# Patient Record
Sex: Female | Born: 1970
Health system: Southern US, Community
[De-identification: ages and names within clinical notes are randomized; demographics above are authoritative.]

## PROBLEM LIST (undated history)

## (undated) DIAGNOSIS — R51 Headache: Secondary | ICD-10-CM

## (undated) DIAGNOSIS — Z9889 Other specified postprocedural states: Secondary | ICD-10-CM

## (undated) DIAGNOSIS — T7840XA Allergy, unspecified, initial encounter: Secondary | ICD-10-CM

## (undated) DIAGNOSIS — K5790 Diverticulosis of intestine, part unspecified, without perforation or abscess without bleeding: Secondary | ICD-10-CM

## (undated) DIAGNOSIS — F419 Anxiety disorder, unspecified: Secondary | ICD-10-CM

## (undated) DIAGNOSIS — R112 Nausea with vomiting, unspecified: Secondary | ICD-10-CM

## (undated) DIAGNOSIS — K219 Gastro-esophageal reflux disease without esophagitis: Secondary | ICD-10-CM

## (undated) DIAGNOSIS — R519 Headache, unspecified: Secondary | ICD-10-CM

## (undated) HISTORY — PX: PELVIC LAPAROSCOPY: SHX162

## (undated) HISTORY — PX: WISDOM TOOTH EXTRACTION: SHX21

## (undated) HISTORY — PX: DIAGNOSTIC LAPAROSCOPY: SUR761

## (undated) HISTORY — DX: Gastro-esophageal reflux disease without esophagitis: K21.9

## (undated) HISTORY — DX: Diverticulosis of intestine, part unspecified, without perforation or abscess without bleeding: K57.90

## (undated) HISTORY — DX: Allergy, unspecified, initial encounter: T78.40XA

## (undated) HISTORY — PX: CHOLECYSTECTOMY: SHX55

---

## 1996-08-16 HISTORY — PX: CYSTOSCOPY: SUR368

## 1998-02-25 ENCOUNTER — Other Ambulatory Visit: Admission: RE | Admit: 1998-02-25 | Discharge: 1998-02-25 | Payer: Self-pay | Admitting: *Deleted

## 1999-03-20 ENCOUNTER — Other Ambulatory Visit: Admission: RE | Admit: 1999-03-20 | Discharge: 1999-03-20 | Payer: Self-pay | Admitting: *Deleted

## 1999-07-01 ENCOUNTER — Other Ambulatory Visit: Admission: RE | Admit: 1999-07-01 | Discharge: 1999-07-01 | Payer: Self-pay | Admitting: *Deleted

## 1999-11-06 ENCOUNTER — Encounter: Admission: RE | Admit: 1999-11-06 | Discharge: 2000-02-04 | Payer: Self-pay | Admitting: *Deleted

## 2000-01-02 ENCOUNTER — Inpatient Hospital Stay (HOSPITAL_COMMUNITY): Admission: AD | Admit: 2000-01-02 | Discharge: 2000-01-02 | Payer: Self-pay | Admitting: *Deleted

## 2000-01-08 ENCOUNTER — Inpatient Hospital Stay (HOSPITAL_COMMUNITY): Admission: AD | Admit: 2000-01-08 | Discharge: 2000-01-11 | Payer: Self-pay | Admitting: *Deleted

## 2000-02-10 ENCOUNTER — Other Ambulatory Visit: Admission: RE | Admit: 2000-02-10 | Discharge: 2000-02-10 | Payer: Self-pay | Admitting: *Deleted

## 2001-02-20 ENCOUNTER — Other Ambulatory Visit: Admission: RE | Admit: 2001-02-20 | Discharge: 2001-02-20 | Payer: Self-pay | Admitting: *Deleted

## 2001-08-16 HISTORY — PX: COLPOSCOPY: SHX161

## 2002-02-28 ENCOUNTER — Other Ambulatory Visit: Admission: RE | Admit: 2002-02-28 | Discharge: 2002-02-28 | Payer: Self-pay | Admitting: Obstetrics & Gynecology

## 2002-04-09 ENCOUNTER — Other Ambulatory Visit: Admission: RE | Admit: 2002-04-09 | Discharge: 2002-04-09 | Payer: Self-pay | Admitting: *Deleted

## 2002-09-14 ENCOUNTER — Other Ambulatory Visit: Admission: RE | Admit: 2002-09-14 | Discharge: 2002-09-14 | Payer: Self-pay | Admitting: Obstetrics & Gynecology

## 2003-04-05 ENCOUNTER — Other Ambulatory Visit: Admission: RE | Admit: 2003-04-05 | Discharge: 2003-04-05 | Payer: Self-pay | Admitting: Obstetrics & Gynecology

## 2013-08-16 HISTORY — PX: CHOLECYSTECTOMY: SHX55

## 2014-01-03 ENCOUNTER — Encounter: Payer: Self-pay | Admitting: Obstetrics & Gynecology

## 2015-03-04 ENCOUNTER — Other Ambulatory Visit: Payer: Self-pay | Admitting: Obstetrics & Gynecology

## 2015-03-20 ENCOUNTER — Encounter (HOSPITAL_COMMUNITY)
Admission: RE | Admit: 2015-03-20 | Discharge: 2015-03-20 | Disposition: A | Discharge status: Home / Self Care | Payer: BLUE CROSS/BLUE SHIELD | Source: Ambulatory Visit | Attending: Obstetrics & Gynecology | Admitting: Obstetrics & Gynecology

## 2015-03-20 ENCOUNTER — Encounter (HOSPITAL_COMMUNITY): Payer: Self-pay

## 2015-03-20 DIAGNOSIS — Z01818 Encounter for other preprocedural examination: Secondary | ICD-10-CM | POA: Insufficient documentation

## 2015-03-20 HISTORY — DX: Other specified postprocedural states: Z98.890

## 2015-03-20 HISTORY — DX: Anxiety disorder, unspecified: F41.9

## 2015-03-20 HISTORY — DX: Nausea with vomiting, unspecified: R11.2

## 2015-03-20 LAB — CBC
HCT: 38 % (ref 36.0–46.0)
Hemoglobin: 12.7 g/dL (ref 12.0–15.0)
MCH: 30.5 pg (ref 26.0–34.0)
MCHC: 33.4 g/dL (ref 30.0–36.0)
MCV: 91.3 fL (ref 78.0–100.0)
Platelets: 324 10*3/uL (ref 150–400)
RBC: 4.16 MIL/uL (ref 3.87–5.11)
RDW: 13.1 % (ref 11.5–15.5)
WBC: 6.6 10*3/uL (ref 4.0–10.5)

## 2015-03-20 LAB — TYPE AND SCREEN
ABO/RH(D): O NEG
ANTIBODY SCREEN: NEGATIVE

## 2015-03-20 LAB — ABO/RH: ABO/RH(D): O NEG

## 2015-03-20 NOTE — Patient Instructions (Addendum)
Your procedure is scheduled on:  March 24, 2015  Enter through the Main Entrance of [facility] at:  11:30 am   Pick up the phone at the desk and dial [phone].  Call this number if you have problems the morning of surgery: [phone].  Remember: Do NOT eat food:  After Sunday night at midnight Do NOT drink clear liquids after:  9:00 am day of surgery  Take these medicines the morning of surgery with a SIP OF WATER:  Welchol and Xanax if needed   Do NOT wear jewelry (body piercing), metal hair clips/bobby pins, make-up, or nail polish. Do NOT wear lotions, powders, or perfumes.  You may wear deoderant. Do NOT shave for 48 hours prior to surgery. Do NOT bring valuables to the hospital. Contacts, dentures, or bridgework may not be worn into surgery. Leave suitcase in car.  After surgery it may be brought to your room.  For patients admitted to the hospital, checkout time is 11:00 AM the day of discharge.

## 2015-03-24 ENCOUNTER — Encounter (HOSPITAL_COMMUNITY): Payer: Self-pay | Admitting: Anesthesiology

## 2015-03-24 ENCOUNTER — Ambulatory Visit (HOSPITAL_COMMUNITY)
Admission: RE | Admit: 2015-03-24 | Discharge: 2015-03-25 | Disposition: A | Discharge status: Home / Self Care | Payer: BLUE CROSS/BLUE SHIELD | Source: Ambulatory Visit | Attending: Obstetrics & Gynecology | Admitting: Obstetrics & Gynecology

## 2015-03-24 ENCOUNTER — Encounter (HOSPITAL_COMMUNITY)
Admission: RE | Disposition: A | Discharge status: Home / Self Care | Payer: Self-pay | Source: Ambulatory Visit | Attending: Obstetrics & Gynecology

## 2015-03-24 ENCOUNTER — Ambulatory Visit (HOSPITAL_COMMUNITY): Payer: BLUE CROSS/BLUE SHIELD | Admitting: Anesthesiology

## 2015-03-24 DIAGNOSIS — N941 Dyspareunia: Secondary | ICD-10-CM | POA: Insufficient documentation

## 2015-03-24 DIAGNOSIS — Z79899 Other long term (current) drug therapy: Secondary | ICD-10-CM | POA: Diagnosis not present

## 2015-03-24 DIAGNOSIS — Z882 Allergy status to sulfonamides status: Secondary | ICD-10-CM | POA: Insufficient documentation

## 2015-03-24 DIAGNOSIS — N946 Dysmenorrhea, unspecified: Secondary | ICD-10-CM | POA: Diagnosis present

## 2015-03-24 DIAGNOSIS — N803 Endometriosis of pelvic peritoneum: Secondary | ICD-10-CM | POA: Insufficient documentation

## 2015-03-24 DIAGNOSIS — F419 Anxiety disorder, unspecified: Secondary | ICD-10-CM | POA: Insufficient documentation

## 2015-03-24 DIAGNOSIS — N72 Inflammatory disease of cervix uteri: Secondary | ICD-10-CM | POA: Diagnosis not present

## 2015-03-24 DIAGNOSIS — Z9889 Other specified postprocedural states: Secondary | ICD-10-CM

## 2015-03-24 HISTORY — PX: LAPAROSCOPIC BILATERAL SALPINGECTOMY: SHX5889

## 2015-03-24 HISTORY — DX: Headache: R51

## 2015-03-24 HISTORY — DX: Headache, unspecified: R51.9

## 2015-03-24 HISTORY — PX: ROBOTIC ASSISTED TOTAL HYSTERECTOMY: SHX6085

## 2015-03-24 LAB — PREGNANCY, URINE: Preg Test, Ur: NEGATIVE

## 2015-03-24 SURGERY — ROBOTIC ASSISTED TOTAL HYSTERECTOMY
Anesthesia: General | Site: Abdomen

## 2015-03-24 MED ORDER — LIDOCAINE HCL (CARDIAC) 20 MG/ML IV SOLN
INTRAVENOUS | Status: AC
  Filled 2015-03-24: qty 5

## 2015-03-24 MED ORDER — ROCURONIUM BROMIDE 100 MG/10ML IV SOLN
INTRAVENOUS | Status: DC | PRN
  Administered 2015-03-24: 10 mg via INTRAVENOUS
  Administered 2015-03-24: 40 mg via INTRAVENOUS
  Administered 2015-03-24: 10 mg via INTRAVENOUS

## 2015-03-24 MED ORDER — ROCURONIUM BROMIDE 100 MG/10ML IV SOLN
INTRAVENOUS | Status: AC
  Filled 2015-03-24: qty 1

## 2015-03-24 MED ORDER — PROPOFOL 10 MG/ML IV BOLUS
INTRAVENOUS | Status: DC | PRN
  Administered 2015-03-24: 140 mg via INTRAVENOUS

## 2015-03-24 MED ORDER — PROPOFOL 10 MG/ML IV BOLUS
INTRAVENOUS | Status: AC
  Filled 2015-03-24: qty 20

## 2015-03-24 MED ORDER — FENTANYL CITRATE (PF) 100 MCG/2ML IJ SOLN
INTRAMUSCULAR | Status: DC | PRN
  Administered 2015-03-24 (×2): 25 ug via INTRAVENOUS
  Administered 2015-03-24: 50 ug via INTRAVENOUS
  Administered 2015-03-24: 100 ug via INTRAVENOUS

## 2015-03-24 MED ORDER — HYDROMORPHONE HCL 1 MG/ML IJ SOLN
INTRAMUSCULAR | Status: AC
  Filled 2015-03-24: qty 1

## 2015-03-24 MED ORDER — BUPIVACAINE HCL (PF) 0.25 % IJ SOLN
INTRAMUSCULAR | Status: AC
  Filled 2015-03-24: qty 30

## 2015-03-24 MED ORDER — ALPRAZOLAM 0.5 MG PO TABS
0.5000 mg | ORAL_TABLET | Freq: Two times a day (BID) | ORAL | Status: DC
  Administered 2015-03-24 – 2015-03-25 (×2): 0.5 mg via ORAL
  Filled 2015-03-24 (×2): qty 1

## 2015-03-24 MED ORDER — MIDAZOLAM HCL 2 MG/2ML IJ SOLN
INTRAMUSCULAR | Status: AC
  Filled 2015-03-24: qty 4

## 2015-03-24 MED ORDER — SODIUM CHLORIDE 0.9 % IJ SOLN
INTRAMUSCULAR | Status: AC
  Filled 2015-03-24: qty 50

## 2015-03-24 MED ORDER — SCOPOLAMINE 1 MG/3DAYS TD PT72
MEDICATED_PATCH | TRANSDERMAL | Status: AC
  Administered 2015-03-24: 1.5 mg via TRANSDERMAL
  Filled 2015-03-24: qty 1

## 2015-03-24 MED ORDER — ONDANSETRON HCL 4 MG/2ML IJ SOLN
INTRAMUSCULAR | Status: DC | PRN
  Administered 2015-03-24 (×2): 2 mg via INTRAVENOUS

## 2015-03-24 MED ORDER — GLYCOPYRROLATE 0.2 MG/ML IJ SOLN
INTRAMUSCULAR | Status: DC | PRN
  Administered 2015-03-24: 0.1 mg via INTRAVENOUS
  Administered 2015-03-24: 0.6 mg via INTRAVENOUS
  Administered 2015-03-24: 0.1 mg via INTRAVENOUS

## 2015-03-24 MED ORDER — KETOROLAC TROMETHAMINE 30 MG/ML IJ SOLN: 30.0000 mg | Freq: Once | INTRAMUSCULAR | Status: DC | PRN

## 2015-03-24 MED ORDER — LACTATED RINGERS IR SOLN
Status: DC | PRN
  Administered 2015-03-24: 3000 mL

## 2015-03-24 MED ORDER — LACTATED RINGERS IV SOLN
INTRAVENOUS | Status: DC
  Administered 2015-03-24 (×2): via INTRAVENOUS

## 2015-03-24 MED ORDER — COLESEVELAM HCL 625 MG PO TABS
625.0000 mg | ORAL_TABLET | Freq: Every day | ORAL | Status: DC
Start: 2015-03-24 — End: 2015-03-25
  Administered 2015-03-25: 625 mg via ORAL
  Filled 2015-03-24 (×3): qty 1

## 2015-03-24 MED ORDER — OXYCODONE-ACETAMINOPHEN 5-325 MG PO TABS
1.0000 | ORAL_TABLET | ORAL | Status: DC | PRN
  Administered 2015-03-25: 1 via ORAL
  Filled 2015-03-24: qty 1

## 2015-03-24 MED ORDER — NEOSTIGMINE METHYLSULFATE 10 MG/10ML IV SOLN
INTRAVENOUS | Status: DC | PRN
  Administered 2015-03-24: 3 mg via INTRAVENOUS

## 2015-03-24 MED ORDER — DEXAMETHASONE SODIUM PHOSPHATE 10 MG/ML IJ SOLN
INTRAMUSCULAR | Status: DC | PRN
  Administered 2015-03-24: 5 mg via INTRAVENOUS

## 2015-03-24 MED ORDER — SCOPOLAMINE 1 MG/3DAYS TD PT72
1.0000 | MEDICATED_PATCH | Freq: Once | TRANSDERMAL | Status: DC
  Administered 2015-03-24: 1.5 mg via TRANSDERMAL

## 2015-03-24 MED ORDER — DEXAMETHASONE SODIUM PHOSPHATE 10 MG/ML IJ SOLN
INTRAMUSCULAR | Status: AC
  Filled 2015-03-24: qty 1

## 2015-03-24 MED ORDER — ONDANSETRON HCL 4 MG/2ML IJ SOLN
INTRAMUSCULAR | Status: AC
  Filled 2015-03-24: qty 2

## 2015-03-24 MED ORDER — MEPERIDINE HCL 25 MG/ML IJ SOLN: 6.2500 mg | INTRAMUSCULAR | Status: DC | PRN

## 2015-03-24 MED ORDER — FENTANYL CITRATE (PF) 250 MCG/5ML IJ SOLN
INTRAMUSCULAR | Status: AC
  Filled 2015-03-24: qty 25

## 2015-03-24 MED ORDER — HYDROMORPHONE HCL 1 MG/ML IJ SOLN
1.0000 mg | INTRAMUSCULAR | Status: DC | PRN
  Administered 2015-03-24 – 2015-03-25 (×3): 1 mg via INTRAVENOUS
  Filled 2015-03-24 (×3): qty 1

## 2015-03-24 MED ORDER — CEFAZOLIN SODIUM-DEXTROSE 2-3 GM-% IV SOLR
INTRAVENOUS | Status: AC
  Filled 2015-03-24: qty 50

## 2015-03-24 MED ORDER — TOPIRAMATE 100 MG PO TABS
100.0000 mg | ORAL_TABLET | Freq: Every day | ORAL | Status: DC
  Administered 2015-03-24: 100 mg via ORAL
  Filled 2015-03-24 (×2): qty 1

## 2015-03-24 MED ORDER — HYDROMORPHONE HCL 1 MG/ML IJ SOLN
0.2500 mg | INTRAMUSCULAR | Status: DC | PRN
  Administered 2015-03-24 (×2): 0.5 mg via INTRAVENOUS

## 2015-03-24 MED ORDER — KETOROLAC TROMETHAMINE 30 MG/ML IJ SOLN
INTRAMUSCULAR | Status: AC
  Filled 2015-03-24: qty 1

## 2015-03-24 MED ORDER — FLUOXETINE HCL 20 MG PO CAPS
20.0000 mg | ORAL_CAPSULE | Freq: Every day | ORAL | Status: DC
  Administered 2015-03-25: 20 mg via ORAL
  Filled 2015-03-24 (×3): qty 1

## 2015-03-24 MED ORDER — LIDOCAINE HCL (CARDIAC) 20 MG/ML IV SOLN
INTRAVENOUS | Status: DC | PRN
  Administered 2015-03-24: 100 mg via INTRAVENOUS

## 2015-03-24 MED ORDER — IBUPROFEN 600 MG PO TABS: 600.0000 mg | ORAL_TABLET | Freq: Four times a day (QID) | ORAL | Status: DC | PRN

## 2015-03-24 MED ORDER — STERILE WATER FOR IRRIGATION IR SOLN
Status: DC | PRN
  Administered 2015-03-24: 1000 mL

## 2015-03-24 MED ORDER — LACTATED RINGERS IV SOLN
INTRAVENOUS | Status: DC
  Administered 2015-03-24 – 2015-03-25 (×2): via INTRAVENOUS

## 2015-03-24 MED ORDER — CEFAZOLIN SODIUM-DEXTROSE 2-3 GM-% IV SOLR
2.0000 g | INTRAVENOUS | Status: AC
  Administered 2015-03-24: 2 g via INTRAVENOUS

## 2015-03-24 MED ORDER — NEOSTIGMINE METHYLSULFATE 10 MG/10ML IV SOLN
INTRAVENOUS | Status: AC
  Filled 2015-03-24: qty 1

## 2015-03-24 MED ORDER — SODIUM CHLORIDE 0.9 % IV SOLN
INTRAVENOUS | Status: DC | PRN
  Administered 2015-03-24: 120 mL

## 2015-03-24 MED ORDER — ROPIVACAINE HCL 5 MG/ML IJ SOLN
INTRAMUSCULAR | Status: AC
Start: 2015-03-24 — End: 2015-03-24
  Filled 2015-03-24: qty 30

## 2015-03-24 MED ORDER — GLYCOPYRROLATE 0.2 MG/ML IJ SOLN
INTRAMUSCULAR | Status: AC
  Filled 2015-03-24: qty 3

## 2015-03-24 MED ORDER — MIDAZOLAM HCL 2 MG/2ML IJ SOLN
INTRAMUSCULAR | Status: DC | PRN
  Administered 2015-03-24: 2 mg via INTRAVENOUS

## 2015-03-24 MED ORDER — BUPIVACAINE HCL (PF) 0.25 % IJ SOLN
INTRAMUSCULAR | Status: DC | PRN
  Administered 2015-03-24: 15 mL

## 2015-03-24 MED ORDER — TRAZODONE HCL 50 MG PO TABS
50.0000 mg | ORAL_TABLET | Freq: Every day | ORAL | Status: DC
  Administered 2015-03-24: 50 mg via ORAL
  Filled 2015-03-24 (×2): qty 1

## 2015-03-24 MED ORDER — PROMETHAZINE HCL 25 MG/ML IJ SOLN
6.2500 mg | INTRAMUSCULAR | Status: DC | PRN
Start: 2015-03-24 — End: 2015-03-24

## 2015-03-24 SURGICAL SUPPLY — 75 items
APPLICATOR COTTON TIP 6IN STRL (MISCELLANEOUS) ×3 IMPLANT
BAG SPEC RTRVL LRG 6X4 10 (ENDOMECHANICALS)
BARRIER ADHS 3X4 INTERCEED (GAUZE/BANDAGES/DRESSINGS) ×3 IMPLANT
BRR ADH 4X3 ABS CNTRL BYND (GAUZE/BANDAGES/DRESSINGS) ×2
CABLE HIGH FREQUENCY MONO STRZ (ELECTRODE) IMPLANT
CATH FOLEY 3WAY  5CC 16FR (CATHETERS) ×1
CATH FOLEY 3WAY 5CC 16FR (CATHETERS) ×2 IMPLANT
CATH ROBINSON RED A/P 16FR (CATHETERS) ×3 IMPLANT
CLOTH BEACON ORANGE TIMEOUT ST (SAFETY) ×3 IMPLANT
CONT PATH 16OZ SNAP LID 3702 (MISCELLANEOUS) ×3 IMPLANT
COVER BACK TABLE 60X90IN (DRAPES) ×6 IMPLANT
COVER TIP SHEARS 8 DVNC (MISCELLANEOUS) ×2 IMPLANT
COVER TIP SHEARS 8MM DA VINCI (MISCELLANEOUS) ×1
DECANTER SPIKE VIAL GLASS SM (MISCELLANEOUS) ×3 IMPLANT
DRAPE WARM FLUID 44X44 (DRAPE) ×3 IMPLANT
DRESSING OPSITE X SMALL 2X3 (GAUZE/BANDAGES/DRESSINGS) ×1 IMPLANT
DRSG COVADERM PLUS 2X2 (GAUZE/BANDAGES/DRESSINGS) ×6 IMPLANT
DRSG OPSITE POSTOP 3X4 (GAUZE/BANDAGES/DRESSINGS) IMPLANT
DURAPREP 26ML APPLICATOR (WOUND CARE) ×3 IMPLANT
ELECT REM PT RETURN 9FT ADLT (ELECTROSURGICAL) ×3
ELECTRODE REM PT RTRN 9FT ADLT (ELECTROSURGICAL) ×2 IMPLANT
GAUZE VASELINE 3X9 (GAUZE/BANDAGES/DRESSINGS) IMPLANT
GLOVE BIO SURGEON STRL SZ 6.5 (GLOVE) ×3 IMPLANT
GLOVE BIO SURGEON STRL SZ7 (GLOVE) ×3 IMPLANT
GLOVE BIOGEL PI IND STRL 7.0 (GLOVE) ×6 IMPLANT
GLOVE BIOGEL PI INDICATOR 7.0 (GLOVE) ×3
GOWN STRL REUS W/TWL LRG LVL3 (GOWN DISPOSABLE) ×9 IMPLANT
IV STOPCOCK 4 WAY 40  W/Y SET (IV SOLUTION)
IV STOPCOCK 4 WAY 40 W/Y SET (IV SOLUTION) IMPLANT
KIT ACCESSORY DA VINCI DISP (KITS) ×1
KIT ACCESSORY DVNC DISP (KITS) ×2 IMPLANT
LEGGING LITHOTOMY PAIR STRL (DRAPES) ×3 IMPLANT
LIQUID BAND (GAUZE/BANDAGES/DRESSINGS) ×5 IMPLANT
MANIPULATOR UTERINE 4.5 ZUMI (MISCELLANEOUS) IMPLANT
OCCLUDER COLPOPNEUMO (BALLOONS) ×1 IMPLANT
PACK LAPAROSCOPY BASIN (CUSTOM PROCEDURE TRAY) ×3 IMPLANT
PACK ROBOT WH (CUSTOM PROCEDURE TRAY) ×3 IMPLANT
PACK ROBOTIC GOWN (GOWN DISPOSABLE) ×3 IMPLANT
PAD POSITIONER PINK NONSTERILE (MISCELLANEOUS) ×3 IMPLANT
PAD PREP 24X48 CUFFED NSTRL (MISCELLANEOUS) ×6 IMPLANT
PENCIL BUTTON HOLSTER BLD 10FT (ELECTRODE) ×3 IMPLANT
POUCH SPECIMEN RETRIEVAL 10MM (ENDOMECHANICALS) IMPLANT
PROTECTOR NERVE ULNAR (MISCELLANEOUS) ×3 IMPLANT
SEALER TISSUE G2 CVD JAW 35 (ENDOMECHANICALS) IMPLANT
SEALER TISSUE G2 CVD JAW 45CM (ENDOMECHANICALS) IMPLANT
SET CYSTO W/LG BORE CLAMP LF (SET/KITS/TRAYS/PACK) ×1 IMPLANT
SET IRRIG TUBING LAPAROSCOPIC (IRRIGATION / IRRIGATOR) ×6 IMPLANT
SET TRI-LUMEN FLTR TB AIRSEAL (TUBING) ×1 IMPLANT
SLEEVE XCEL OPT CAN 5 100 (ENDOMECHANICALS) IMPLANT
SUT MNCRL AB 4-0 PS2 18 (SUTURE) ×6 IMPLANT
SUT VIC AB 0 CT1 27 (SUTURE) ×6
SUT VIC AB 0 CT1 27XBRD ANBCTR (SUTURE) ×4 IMPLANT
SUT VIC AB 4-0 PS2 27 (SUTURE) ×6 IMPLANT
SUT VICRYL 0 UR6 27IN ABS (SUTURE) ×6 IMPLANT
SUT VLOC 180 0 9IN  GS21 (SUTURE)
SUT VLOC 180 0 9IN GS21 (SUTURE) IMPLANT
SYR 50ML LL SCALE MARK (SYRINGE) ×3 IMPLANT
SYSTEM CONVERTIBLE TROCAR (TROCAR) IMPLANT
TIP RUMI ORANGE 6.7MMX12CM (TIP) IMPLANT
TIP UTERINE 5.1X6CM LAV DISP (MISCELLANEOUS) ×1 IMPLANT
TIP UTERINE 6.7X10CM GRN DISP (MISCELLANEOUS) IMPLANT
TIP UTERINE 6.7X6CM WHT DISP (MISCELLANEOUS) ×1 IMPLANT
TIP UTERINE 6.7X8CM BLUE DISP (MISCELLANEOUS) ×1 IMPLANT
TOWEL OR 17X24 6PK STRL BLUE (TOWEL DISPOSABLE) ×9 IMPLANT
TRAY FOLEY CATH SILVER 14FR (SET/KITS/TRAYS/PACK) ×3 IMPLANT
TROCAR 12M 150ML BLUNT (TROCAR) IMPLANT
TROCAR BALLN 12MMX100 BLUNT (TROCAR) ×3 IMPLANT
TROCAR DISP BLADELESS 8 DVNC (TROCAR) ×2 IMPLANT
TROCAR DISP BLADELESS 8MM (TROCAR) ×1
TROCAR PORT AIRSEAL 5X120 (TROCAR) ×1 IMPLANT
TROCAR XCEL 12X100 BLDLESS (ENDOMECHANICALS) ×3 IMPLANT
TROCAR XCEL NON-BLD 11X100MML (ENDOMECHANICALS) IMPLANT
TROCAR XCEL NON-BLD 5MMX100MML (ENDOMECHANICALS) ×3 IMPLANT
WARMER LAPAROSCOPE (MISCELLANEOUS) ×3 IMPLANT
WATER STERILE IRR 1000ML POUR (IV SOLUTION) ×9 IMPLANT

## 2015-03-24 NOTE — Transfer of Care (Signed)
Immediate Anesthesia Transfer of Care Note  Patient: Sherry Dunn  Procedure(s) Performed: Procedure(s): ROBOTIC ASSISTED TOTAL HYSTERECTOMY WITH BILATERAL SALPINGECTOMY, FULGUATION OF PELVIC ENDOMETERIOSIS (N/A) LAPAROSCOPIC BILATERAL SALPINGECTOMY (Bilateral)  Patient Location: PACU  Anesthesia Type:General  Level of Consciousness: awake, alert  and oriented  Airway & Oxygen Therapy: Patient Spontanous Breathing and Patient connected to nasal cannula oxygen  Post-op Assessment: Report given to RN and Post -op Vital signs reviewed and stable  Post vital signs: Reviewed and stable  Last Vitals:  Filed Vitals:   03/24/15 1148  BP: 99/75  Pulse: 64  Temp: 36.7 C  Resp: 20    Complications: No apparent anesthesia complications

## 2015-03-24 NOTE — H&P (Addendum)
Sherry Dunn is an 44 y.o. female G11P1A2  Refractory dysmenorrhea and dyspareunia for Robotic TLH/Bilateral Salpingectomy  Pertinent Gynecological History: Menses: flow is moderate Contraception: condoms Blood transfusions: none Sexually transmitted diseases: no past history Previous GYN Procedures: Dx LPS Last mammogram: normal Last pap: normal   Menstrual History:  No LMP recorded.    Past Medical History  Diagnosis Date  . PONV (postoperative nausea and vomiting)     nausea only   . Anxiety   . Vaginal delivery 2001  . Headache     Past Surgical History  Procedure Laterality Date  . Cholecystectomy    . Diagnostic laparoscopy    . Wisdom tooth extraction      History reviewed. No pertinent family history.  Social History:  reports that she has never smoked. She has never used smokeless tobacco. She reports that she does not drink alcohol or use illicit drugs.  Allergies:  Allergies  Allergen Reactions  . Flagyl [Metronidazole] Hives  . Sulfa Antibiotics Hives    Prescriptions prior to admission  Medication Sig Dispense Refill Last Dose  . ALPRAZolam (XANAX) 0.5 MG tablet Take 0.5 mg by mouth 2 (two) times daily.   03/24/2015 at Unknown time  . colesevelam (WELCHOL) 625 MG tablet Take 625 mg by mouth daily.   03/24/2015 at Unknown time  . FLUoxetine (PROZAC) 20 MG capsule Take 20 mg by mouth daily.   03/23/2015 at Unknown time  . topiramate (TOPAMAX) 100 MG tablet Take 100 mg by mouth at bedtime.   03/23/2015 at Unknown time  . traZODone (DESYREL) 50 MG tablet Take 50 mg by mouth at bedtime.   03/23/2015 at Unknown time    ROS neg  Blood pressure 99/75, pulse 64, temperature 98.1 F (36.7 C), temperature source Oral, resp. rate 20, SpO2 100 %. Physical Exam   Pelvic US 01/2015 Uterus normal size.  Ovaries wnl x 2.  Results for orders placed or performed during the hospital encounter of 03/24/15 (from the past 24 hour(s))  Pregnancy, urine     Status: None   Collection Time: 03/24/15 11:30 AM  Result Value Ref Range   Preg Test, Ur NEGATIVE NEGATIVE      Assessment/Plan: Refractory Dysmenorrhea and Dyspareunia for Robotic TLH/Bilat Salpingectomy.  Surgery and risks reviewed.  Sherry Dunn 03/24/2015, 1:29 PM

## 2015-03-24 NOTE — Anesthesia Preprocedure Evaluation (Addendum)
Anesthesia Evaluation  Patient identified by MRN, date of birth, ID band Patient awake    Reviewed: Allergy & Precautions, H&P , NPO status , Patient's Chart, lab work & pertinent test results  History of Anesthesia Complications (+) PONV  Airway Mallampati: I  TM Distance: >3 FB Neck ROM: full    Dental no notable dental hx. (+) Teeth Intact   Pulmonary neg pulmonary ROS,  breath sounds clear to auscultation  Pulmonary exam normal       Cardiovascular negative cardio ROS Normal cardiovascular examRhythm:Regular     Neuro/Psych    GI/Hepatic negative GI ROS, Neg liver ROS,   Endo/Other  negative endocrine ROS  Renal/GU negative Renal ROS     Musculoskeletal   Abdominal Normal abdominal exam  (+)  Abdomen: soft.    Peds  Hematology negative hematology ROS (+)   Anesthesia Other Findings   Reproductive/Obstetrics negative OB ROS                            Anesthesia Physical Anesthesia Plan  ASA: II  Anesthesia Plan: General   Post-op Pain Management:    Induction: Intravenous  Airway Management Planned: Oral ETT  Additional Equipment:   Intra-op Plan:   Post-operative Plan: Extubation in OR  Informed Consent: I have reviewed the patients History and Physical, chart, labs and discussed the procedure including the risks, benefits and alternatives for the proposed anesthesia with the patient or authorized representative who has indicated his/her understanding and acceptance.     Plan Discussed with: CRNA, Surgeon and Anesthesiologist  Anesthesia Plan Comments:         Anesthesia Quick Evaluation

## 2015-03-24 NOTE — Anesthesia Procedure Notes (Addendum)
Procedure Name: Intubation Date/Time: 03/24/2015 1:50 PM Performed by: Harriett Rush, Amarachi Kotz A Pre-anesthesia Checklist: Patient identified, Emergency Drugs available, Suction available, Patient being monitored and Timeout performed Patient Re-evaluated:Patient Re-evaluated prior to inductionOxygen Delivery Method: Circle system utilized Preoxygenation: Pre-oxygenation with 100% oxygen Intubation Type: IV induction Ventilation: Mask ventilation without difficulty Laryngoscope Size: Miller and 2 Grade View: Grade I Tube type: Oral Tube size: 6.5 mm Number of attempts: 1 Placement Confirmation: ETT inserted through vocal cords under direct vision,  positive ETCO2 and breath sounds checked- equal and bilateral Secured at: 20 cm Tube secured with: Tape Dental Injury: Teeth and Oropharynx as per pre-operative assessment

## 2015-03-24 NOTE — Anesthesia Postprocedure Evaluation (Signed)
  Anesthesia Post-op Note  Patient: Arna Medici  Procedure(s) Performed: Procedure(s): ROBOTIC ASSISTED TOTAL HYSTERECTOMY WITH BILATERAL SALPINGECTOMY, FULGUATION OF PELVIC ENDOMETERIOSIS (N/A) LAPAROSCOPIC BILATERAL SALPINGECTOMY (Bilateral)  Patient Location: PACU  Anesthesia Type:General  Level of Consciousness: awake and alert   Airway and Oxygen Therapy: Patient Spontanous Breathing  Post-op Pain: mild  Post-op Assessment: Post-op Vital signs reviewed and Patient's Cardiovascular Status Stable              Post-op Vital Signs: Reviewed and stable  Last Vitals:  Filed Vitals:   03/24/15 1630  BP: 121/71  Pulse: 69  Temp: 36.9 C  Resp: 16    Complications: No apparent anesthesia complications

## 2015-03-24 NOTE — Op Note (Addendum)
03/24/2015  4:38 PM  PATIENT:  Sherry Dunn  44 y.o. female  PRE-OPERATIVE DIAGNOSIS:  Persistent Severe Refractory Dysmenorrhea, Dyspareunia  POST-OPERATIVE DIAGNOSIS:  Persistent severe refractory dysmenorrhea, dyspareunia, mild pelvic endometriosis  PROCEDURE:  Procedure(s): ROBOTIC ASSISTED TOTAL HYSTERECTOMY WITH BILATERAL SALPINGECTOMY, FULGUATION OF PELVIC ENDOMETRIOSIS   SURGEON:  Surgeon(s): Genia Del, MD  ASSISTANTS: Shea Evans MD   ANESTHESIA:   general   PROCEDURE:  Under general anesthesia with endotracheal intubation the patient is feeling lithotomy position. She is prepped with chlorpropamide the abdomen and with Betadine and the suprapubic, vulvar and vaginal areas. She is draped as usual.  The Foley is inserted in the bladder. A #8 Rumi with the medium Koh ring are put in place without difficulty.  The tenaculum and speculum were removed.  Abdominally, we make a 2 cm incision at the supraumbilical area after infiltrating Marcaine one quarter plain.  The aponeurosis is opened under direct vision with Mayo scissors.  The parietal peritoneum is opened bluntly with a finger.  A pursestring stitch of Vicryl 0 is done on the aponeurosis.  The Roseanne Reno is inserted at that level and a pneumoperitoneum was created with CO2.  The camera is inserted and that port and we confirm no adhesion with the anterior wall.  The liver and appendix appear normal and pictures are taken.  The uterus is normal in size and appearance with 2 normal tubes.  Both ovaries are normal in size and appearance.  The lesion of endometriosis is present on the left anterior cul-de-sac close to the bladder and at the left posterior cul-de-sac inside the uterosacral ligament.  Pictures are taken of those lesions.  We use a semicircular configuration for port placement. Marcaine one quarter plain was infiltrated at all sites and small incisions are made with the scalpel. 2 robotic ports are inserted under direct  vision on the right side.  One robotic port is inserted under direct vision on the left lower side. And the assistant port a 5 mm port is inserted under direct vision at the left superior aspect.  The patient is positioned in deep Trendelenburg, but not maximum.  She tolerates it well.  The robot is docked from the right side.  The robotic instruments are inserted under direct vision with the Endo Shears scissors in the right arm, the PK in the left arm and the pro grasp in the third arm.  We go to the console.      Both ureters are well visualized and normal anatomic position.  We started on the left side with cauterization and transection of the left round ligament. We then cauterized in section the left mesosalpinx.  We cauterized in section the left utero-ovarian ligament.  We then opened the visceral peritoneum anteriorly and start reclining the bladder downward.  We proceeded exactly the same way on the right side.  The bladder is further reclined downward past the [employer]  We then cauterized and section the left uterine artery and then the right uterine artery. The occluder was insufflated. The superior aspect of the vagina was opened on top of the [place] ring with the tip of the Endo Shears scissors, anteriorly, at the back and finally on each side. The uterus with both tubes and cervix were completely detached and passed vaginally. The specimen was sent to pathology.  We then safely cauterized the 2 lesions of endometriosis previously described while lifting the peritoneum.  We further descended the bladder to allow safe closure of the vaginal  vault.  The PK was used to complete hemostasis where necessary.  The bladder was inflated with saline to confirm its position and intact status.  Urine was clear.  We then switched the instruments to the cutting needle driver in the first arm, the long tip in the second arm and the PK in the third arm.  The vaginal vault was closed with a 9 inches V lock 0 in a  running suture starting at the right angle all the way to the left angle and then back to the midline.  The needle was removed from the abdomen through the robotic port.  We irrigated and suctioned the abdominopelvic cavity.  We visualized both ureters with good peristalsis.  Hemostasis was completed with the PK where necessary.  All robotic instruments were removed. The robot was undocked. And we went to laparoscopy time.      The abdominal pelvic cavities were irrigated and suctioned.  We had small bleeders at the bladder flap that we controled with the Kleppinger.  We irrigated and suctioned once more. Hemostasis was adequate at all levels. All instruments were removed. The CO2 was evacuated.  60 cc of bupivicaine was irrigated in the abdominopelvic cavity. The Bovie was used where necessary at the incisions.  The supraumbilical incision was closed at the aponeurosis by attaching the pursestring stitch. We then closed all incisions with a subcuticular suture of Vicryl 4-0.  Dermabond was added on all incisions.  The occluder was removed from the vagina and good hemostasis was present.  The patient was brought to recovery room in good and stable status.  ESTIMATED BLOOD LOSS: 30 cc   Intake/Output Summary (Last 24 hours) at 03/24/15 1638 Last data filed at 03/24/15 1634  Gross per 24 hour  Intake   1400 ml  Output    130 ml  Net   1270 ml     BLOOD ADMINISTERED:none   LOCAL MEDICATIONS USED:  Marcaine and BUPIVICAINE   SPECIMEN:  Source of Specimen:  Uterus with cervix and bilateral tubes  DISPOSITION OF SPECIMEN:  PATHOLOGY  COUNTS:  YES  PLAN OF CARE: Transfer to PACU  Genia Del MD  03/24/15 at 4:38 pm

## 2015-03-25 ENCOUNTER — Encounter (HOSPITAL_COMMUNITY): Payer: Self-pay | Admitting: Obstetrics & Gynecology

## 2015-03-25 DIAGNOSIS — N72 Inflammatory disease of cervix uteri: Secondary | ICD-10-CM | POA: Diagnosis not present

## 2015-03-25 LAB — CBC
HCT: 35.6 % — ABNORMAL LOW (ref 36.0–46.0)
Hemoglobin: 11.5 g/dL — ABNORMAL LOW (ref 12.0–15.0)
MCH: 29.9 pg (ref 26.0–34.0)
MCHC: 32.3 g/dL (ref 30.0–36.0)
MCV: 92.5 fL (ref 78.0–100.0)
PLATELETS: 295 10*3/uL (ref 150–400)
RBC: 3.85 MIL/uL — ABNORMAL LOW (ref 3.87–5.11)
RDW: 12.8 % (ref 11.5–15.5)
WBC: 14.1 10*3/uL — ABNORMAL HIGH (ref 4.0–10.5)

## 2015-03-25 MED ORDER — OXYCODONE-ACETAMINOPHEN 7.5-325 MG PO TABS: 1.0000 | ORAL_TABLET | ORAL | Status: DC | PRN

## 2015-03-25 MED ORDER — ONDANSETRON HCL 4 MG PO TABS: 4.0000 mg | ORAL_TABLET | Freq: Three times a day (TID) | ORAL | Status: DC | PRN

## 2015-03-25 NOTE — Progress Notes (Signed)
1 Day Post-Op Procedure(s) (LRB): ROBOTIC ASSISTED TOTAL HYSTERECTOMY WITH BILATERAL SALPINGECTOMY, FULGUATION OF PELVIC ENDOMETERIOSIS (N/A) LAPAROSCOPIC BILATERAL SALPINGECTOMY (Bilateral)   Subjective: Patient reports that pain is well managed.  Tolerating normal diet as tolerated  diet without difficulty. No nausea / vomiting.  Ambulating and voiding.  Objective: BP 93/52 mmHg  Pulse 53  Temp(Src) 98.3 F (36.8 C) (Oral)  Resp 16  SpO2 98% Lungs: clear Heart: normal rate and rhythm Abdomen:soft and appropriately tender Extremities: Homans sign is negative, no sign of DVT Incision: healing well Results for Sherry Dunn (MRN [id number]) as of 03/25/2015 13:06  Ref. Range 03/25/2015 05:31  WBC Latest Ref Range: 4.0-10.5 K/uL 14.1 (H)  RBC Latest Ref Range: 3.87-5.11 MIL/uL 3.85 (L)  Hemoglobin Latest Ref Range: 12.0-15.0 g/dL 71.2 (L)  HCT Latest Ref Range: 36.0-46.0 % 35.6 (L)  MCV Latest Ref Range: 78.0-100.0 fL 92.5  MCH Latest Ref Range: 26.0-34.0 pg 29.9  MCHC Latest Ref Range: 30.0-36.0 g/dL 45.8  RDW Latest Ref Range: 11.5-15.5 % 12.8  Platelets Latest Ref Range: 150-400 K/uL 295     Assessment: s/p Procedure(s): ROBOTIC ASSISTED TOTAL HYSTERECTOMY WITH BILATERAL SALPINGECTOMY, FULGUATION OF PELVIC ENDOMETERIOSIS LAPAROSCOPIC BILATERAL SALPINGECTOMY: progressing well  Plan: Discharge home     Sherry Dunn,Sherry Dunn 03/25/2015, 1:05 PM

## 2015-03-25 NOTE — Progress Notes (Signed)
Teaching complete  Ambulated out 

## 2015-03-25 NOTE — Addendum Note (Signed)
Addendum  created 03/25/15 0751 by Jhonnie Garner, CRNA   Modules edited: Notes Section   Notes Section:  File: [id number]

## 2015-03-25 NOTE — Discharge Instructions (Signed)
Total Laparoscopic Hysterectomy, Care After °Refer to this sheet in the next few weeks. These instructions provide you with information on caring for yourself after your procedure. Your health care provider may also give you more specific instructions. Your treatment has been planned according to current medical practices, but problems sometimes occur. Call your health care provider if you have any problems or questions after your procedure. °WHAT TO EXPECT AFTER THE PROCEDURE °· Pain and bruising at the incision sites. You will be given pain medicine to control it. °· Menopausal symptoms such as hot flashes, night sweats, and insomnia if your ovaries were removed. °· Sore throat from the breathing tube that was inserted during surgery. °HOME CARE INSTRUCTIONS °· Only take over-the-counter or prescription medicines for pain, discomfort, or fever as directed by your health care provider.   °· Do not take aspirin. It can cause bleeding.   °· Do not drive when taking pain medicine.   °· Follow your health care provider's advice regarding diet, exercise, lifting, driving, and general activities.   °· Resume your usual diet as directed and allowed.   °· Get plenty of rest and sleep.   °· Do not douche, use tampons, or have sexual intercourse for at least 6 weeks, or until your health care provider gives you permission.   °· Change your bandages (dressings) as directed by your health care provider.   °· Monitor your temperature and notify your health care provider of a fever.   °· Take showers instead of baths for 2-3 weeks.   °· Do not drink alcohol until your health care provider gives you permission.   °· If you develop constipation, you may take a mild laxative with your health care provider's permission. Bran foods may help with constipation problems. Drinking enough fluids to keep your urine clear or pale yellow may help as well.   °· Try to have someone home with you for 1-2 weeks to help around the house.    °· Keep all of your follow-up appointments as directed by your health care provider.   °SEEK MEDICAL CARE IF: °· You have swelling, redness, or increasing pain around your incision sites.   °· You have pus coming from your incision.   °· You notice a bad smell coming from your incision.   °· Your incision breaks open.   °· You feel dizzy or lightheaded.   °· You have pain or bleeding when you urinate.   °· You have persistent diarrhea.   °· You have persistent nausea and vomiting.   °· You have abnormal vaginal discharge.   °· You have a rash.   °· You have any type of abnormal reaction or develop an allergy to your medicine.   °· You have poor pain control with your prescribed medicine.   °SEEK IMMEDIATE MEDICAL CARE IF: °· You have chest pain or shortness of breath. °· You have severe abdominal pain that is not relieved with pain medicine. °· You have pain or swelling in your legs. °MAKE SURE YOU: °· Understand these instructions. °· Will watch your condition. °· Will get help right away if you are not doing well or get worse. °Document Released: 05/23/2013 Document Revised: 08/07/2013 Document Reviewed: 05/23/2013 °[employer]® Patient Information ©2015 [employer]. This information is not intended to replace advice given to you by your health care provider. Make sure you discuss any questions you have with your health care provider. ° °

## 2015-03-25 NOTE — Anesthesia Postprocedure Evaluation (Signed)
Anesthesia Post Note  Patient: Sherry Dunn  Procedure(s) Performed: Procedure(s) (LRB): ROBOTIC ASSISTED TOTAL HYSTERECTOMY WITH BILATERAL SALPINGECTOMY, FULGUATION OF PELVIC ENDOMETERIOSIS (N/A) LAPAROSCOPIC BILATERAL SALPINGECTOMY (Bilateral)  Anesthesia type: General  Patient location: Mother/Baby  Post pain: Pain level controlled  Post assessment: Post-op Vital signs reviewed  Last Vitals:  Filed Vitals:   03/25/15 0516  BP: 129/76  Pulse: 55  Temp: 36.6 C  Resp: 16    Post vital signs: Reviewed  Level of consciousness: awake and alert   Complications: No apparent anesthesia complications

## 2015-03-25 NOTE — Discharge Summary (Signed)
  Physician Discharge Summary  Patient ID: Sherry Dunn MRN: [id number] DOB/AGE: 1971/04/01 44 y.o.  Admit date: 03/24/2015 Discharge date: 03/25/2015  Admission Diagnoses: Persistent Severe Refractory Dysmenorrhea, Dyspareunia  Discharge Diagnoses: Persistent Severe Refractory Dysmenorrhea, Dyspareunia, Mild pelvic Endometriosis        Active Problems:   Postoperative state   Discharged Condition: good  Hospital Course: good  Consults: None  Treatments: surgery: Robotic Total Laparoscopic Hysterectomy with Bilateral Salpingectomy and Fulguration of Endometriosis  Disposition: D/C home     Medication List    TAKE these medications        ALPRAZolam 0.5 MG tablet  Commonly known as:  XANAX  Take 0.5 mg by mouth 2 (two) times daily.     colesevelam 625 MG tablet  Commonly known as:  WELCHOL  Take 625 mg by mouth daily.     FLUoxetine 20 MG capsule  Commonly known as:  PROZAC  Take 20 mg by mouth daily.     ondansetron 4 MG tablet  Commonly known as:  ZOFRAN  Take 1 tablet (4 mg total) by mouth every 8 (eight) hours as needed for nausea or vomiting.     oxyCODONE-acetaminophen 7.5-325 MG per tablet  Commonly known as:  PERCOCET  Take 1 tablet by mouth every 4 (four) hours as needed for severe pain.     topiramate 100 MG tablet  Commonly known as:  TOPAMAX  Take 100 mg by mouth at bedtime.     traZODone 50 MG tablet  Commonly known as:  DESYREL  Take 50 mg by mouth at bedtime.           Follow-up Information    Follow up with Markee Remlinger,[name], MD In 3 weeks.   Specialty:  Obstetrics and Gynecology   Contact information:   [address] [place] [address] [phone]       Signed: Genia Del, MD 03/25/2015, 1:21 PM

## 2015-12-25 DIAGNOSIS — H93239 Hyperacusis, unspecified ear: Secondary | ICD-10-CM | POA: Insufficient documentation

## 2015-12-25 DIAGNOSIS — H9312 Tinnitus, left ear: Secondary | ICD-10-CM | POA: Insufficient documentation

## 2017-02-09 DIAGNOSIS — K591 Functional diarrhea: Secondary | ICD-10-CM | POA: Diagnosis not present

## 2017-02-09 DIAGNOSIS — G43909 Migraine, unspecified, not intractable, without status migrainosus: Secondary | ICD-10-CM | POA: Diagnosis not present

## 2017-02-09 DIAGNOSIS — F4323 Adjustment disorder with mixed anxiety and depressed mood: Secondary | ICD-10-CM | POA: Diagnosis not present

## 2017-02-09 DIAGNOSIS — Z79899 Other long term (current) drug therapy: Secondary | ICD-10-CM | POA: Diagnosis not present

## 2017-02-09 DIAGNOSIS — Z6822 Body mass index (BMI) 22.0-22.9, adult: Secondary | ICD-10-CM | POA: Diagnosis not present

## 2017-02-14 MED FILL — traZODone HCL 100 MG TABS: 100 | 30 days supply | Qty: 30 | Fill #0

## 2017-02-14 MED FILL — FLUoxetine HCL 20 MG CAPS: 20 | 30 days supply | Qty: 30 | Fill #0

## 2017-02-14 MED FILL — OMEPRAZOLE DR 40 MG CAPSULE: 40 | 30 days supply | Qty: 30 | Fill #0

## 2017-02-14 MED FILL — lamoTRIgine 25 MG TABS: 25 | 30 days supply | Qty: 60 | Fill #0

## 2017-02-14 MED FILL — TOPIRAMATE 100 MG TABLET: 100 | 30 days supply | Qty: 30 | Fill #0

## 2017-02-14 MED FILL — ALPRAZolam 0.5 MG TABS: 0.5 | 22 days supply | Qty: 45 | Fill #0

## 2017-03-24 MED FILL — ALPRAZolam 0.5 MG TABS: 0.5 | 22 days supply | Qty: 45 | Fill #1

## 2017-03-24 MED FILL — TOPIRAMATE 100 MG TABLET: 100 | 30 days supply | Qty: 30 | Fill #1

## 2017-04-11 MED FILL — lamoTRIgine 25 MG TABS: 25 | 30 days supply | Qty: 60 | Fill #1

## 2017-04-11 MED FILL — FLUoxetine HCL 20 MG CAPS: 20 | 30 days supply | Qty: 30 | Fill #1

## 2017-04-11 MED FILL — traZODone HCL 100 MG TABS: 100 | 30 days supply | Qty: 30 | Fill #0

## 2017-04-26 MED FILL — TOPIRAMATE 100 MG TABLET: 100 | 30 days supply | Qty: 30 | Fill #2

## 2017-05-11 MED FILL — ALPRAZolam 0.5 MG TABS: 0.5 | 22 days supply | Qty: 45 | Fill #2

## 2017-05-11 MED FILL — FLUoxetine HCL 20 MG CAPS: 20 | 30 days supply | Qty: 30 | Fill #2

## 2017-05-11 MED FILL — traZODone HCL 100 MG TABS: 100 | 30 days supply | Qty: 30 | Fill #1

## 2017-05-11 MED FILL — OMEPRAZOLE DR 40 MG CAPSULE: 40 | 30 days supply | Qty: 30 | Fill #1

## 2017-05-11 MED FILL — lamoTRIgine 25 MG TABS: 25 | 30 days supply | Qty: 60 | Fill #2

## 2017-05-30 MED FILL — TOPIRAMATE 100 MG TABLET: 100 | 30 days supply | Qty: 30 | Fill #3

## 2017-06-09 MED FILL — lamoTRIgine 25 MG TABS: 25 | 30 days supply | Qty: 60 | Fill #3

## 2017-06-09 MED FILL — FLUoxetine HCL 20 MG CAPS: 20 | 30 days supply | Qty: 30 | Fill #3

## 2017-06-17 DIAGNOSIS — Z1339 Encounter for screening examination for other mental health and behavioral disorders: Secondary | ICD-10-CM | POA: Diagnosis not present

## 2017-06-17 DIAGNOSIS — F4323 Adjustment disorder with mixed anxiety and depressed mood: Secondary | ICD-10-CM | POA: Diagnosis not present

## 2017-06-17 DIAGNOSIS — G43909 Migraine, unspecified, not intractable, without status migrainosus: Secondary | ICD-10-CM | POA: Diagnosis not present

## 2017-06-17 DIAGNOSIS — Z1331 Encounter for screening for depression: Secondary | ICD-10-CM | POA: Diagnosis not present

## 2017-06-17 DIAGNOSIS — Z6823 Body mass index (BMI) 23.0-23.9, adult: Secondary | ICD-10-CM | POA: Diagnosis not present

## 2017-06-17 DIAGNOSIS — K591 Functional diarrhea: Secondary | ICD-10-CM | POA: Diagnosis not present

## 2017-06-19 MED FILL — traZODone HCL 100 MG TABS: 100 | 30 days supply | Qty: 30 | Fill #2

## 2017-06-20 MED FILL — ONDANSETRON HCL 8 MG TABLET: 8 | 5 days supply | Qty: 10 | Fill #0

## 2017-06-20 MED FILL — OMEPRAZOLE DR 40 MG CAPSULE: 40 | 90 days supply | Qty: 90 | Fill #0

## 2017-06-21 MED FILL — ALPRAZolam 1 MG TABS: 1 | 22 days supply | Qty: 45 | Fill #0

## 2017-06-21 MED FILL — WELCHOL 625 MG TABLET: 625 | 30 days supply | Qty: 120 | Fill #0

## 2017-06-21 MED FILL — TROKENDI XR 100 MG CAPSULE: 100 | 30 days supply | Qty: 30 | Fill #0

## 2017-07-10 MED FILL — lamoTRIgine 25 MG TABS: 25 | 30 days supply | Qty: 60 | Fill #4

## 2017-07-10 MED FILL — FLUoxetine HCL 20 MG CAPS: 20 | 30 days supply | Qty: 30 | Fill #4

## 2017-07-12 MED FILL — tiZANidine HCL 4 MG TABS: 4 | 30 days supply | Qty: 15 | Fill #0

## 2017-07-12 MED FILL — ZOLMitriptan 5 MG TABS: 5 | 30 days supply | Qty: 8 | Fill #0

## 2017-07-19 DIAGNOSIS — Z6823 Body mass index (BMI) 23.0-23.9, adult: Secondary | ICD-10-CM | POA: Diagnosis not present

## 2017-07-19 DIAGNOSIS — K219 Gastro-esophageal reflux disease without esophagitis: Secondary | ICD-10-CM | POA: Diagnosis not present

## 2017-07-19 DIAGNOSIS — R1013 Epigastric pain: Secondary | ICD-10-CM | POA: Diagnosis not present

## 2017-07-20 MED FILL — TROKENDI XR 100 MG CAPSULE: 100 | 30 days supply | Qty: 30 | Fill #1

## 2017-07-20 MED FILL — traZODone HCL 100 MG TABS: 100 | 30 days supply | Qty: 30 | Fill #3

## 2017-07-21 ENCOUNTER — Encounter: Payer: Self-pay | Admitting: Gastroenterology

## 2017-08-10 MED FILL — ALPRAZolam 1 MG TABS: 1 | 22 days supply | Qty: 45 | Fill #1

## 2017-08-10 MED FILL — lamoTRIgine 25 MG TABS: 25 | 30 days supply | Qty: 60 | Fill #5

## 2017-08-10 MED FILL — FLUoxetine HCL 20 MG CAPS: 20 | 30 days supply | Qty: 30 | Fill #5

## 2017-08-22 MED FILL — traZODone HCL 100 MG TABS: 100 | 90 days supply | Qty: 90 | Fill #0

## 2017-08-22 MED FILL — TROKENDI XR 100 MG CAPSULE: 100 | 30 days supply | Qty: 30 | Fill #2

## 2017-08-31 DIAGNOSIS — H6692 Otitis media, unspecified, left ear: Secondary | ICD-10-CM | POA: Diagnosis not present

## 2017-08-31 DIAGNOSIS — J0100 Acute maxillary sinusitis, unspecified: Secondary | ICD-10-CM | POA: Diagnosis not present

## 2017-09-08 ENCOUNTER — Encounter: Payer: Self-pay | Admitting: Gastroenterology

## 2017-09-08 ENCOUNTER — Encounter (INDEPENDENT_AMBULATORY_CARE_PROVIDER_SITE_OTHER): Payer: Self-pay

## 2017-09-08 ENCOUNTER — Ambulatory Visit: Payer: 59 | Admitting: Gastroenterology

## 2017-09-08 ENCOUNTER — Ambulatory Visit (INDEPENDENT_AMBULATORY_CARE_PROVIDER_SITE_OTHER)
Admission: RE | Admit: 2017-09-08 | Discharge: 2017-09-08 | Disposition: A | Discharge status: Home / Self Care | Payer: 59 | Source: Ambulatory Visit | Attending: Gastroenterology | Admitting: Gastroenterology

## 2017-09-08 VITALS — BP 102/66 | HR 68 | Ht 64.0 in | Wt 135.4 lb

## 2017-09-08 DIAGNOSIS — K5909 Other constipation: Secondary | ICD-10-CM | POA: Diagnosis not present

## 2017-09-08 DIAGNOSIS — R197 Diarrhea, unspecified: Secondary | ICD-10-CM

## 2017-09-08 DIAGNOSIS — K588 Other irritable bowel syndrome: Secondary | ICD-10-CM

## 2017-09-08 DIAGNOSIS — R1013 Epigastric pain: Secondary | ICD-10-CM

## 2017-09-08 DIAGNOSIS — R140 Abdominal distension (gaseous): Secondary | ICD-10-CM

## 2017-09-08 DIAGNOSIS — K602 Anal fissure, unspecified: Secondary | ICD-10-CM

## 2017-09-08 DIAGNOSIS — R1084 Generalized abdominal pain: Secondary | ICD-10-CM

## 2017-09-08 DIAGNOSIS — R110 Nausea: Secondary | ICD-10-CM

## 2017-09-08 DIAGNOSIS — R1012 Left upper quadrant pain: Secondary | ICD-10-CM

## 2017-09-08 DIAGNOSIS — K5900 Constipation, unspecified: Secondary | ICD-10-CM | POA: Diagnosis not present

## 2017-09-08 MED ORDER — AMBULATORY NON FORMULARY MEDICATION: 1 refills | Status: DC

## 2017-09-08 MED FILL — lamoTRIgine 25 MG TABS: 25 | 30 days supply | Qty: 60 | Fill #6

## 2017-09-08 MED FILL — FLUoxetine HCL 20 MG CAPS: 20 | 30 days supply | Qty: 30 | Fill #6

## 2017-09-08 NOTE — Patient Instructions (Signed)
Go to the basement today for your Xrays  You have been scheduled for an endoscopy. Please follow written instructions given to you at your visit today. If you use inhalers (even only as needed), please bring them with you on the day of your procedure.   We have given you a prescription for rectal cream to carry to [facility]  Take Benefiber 1 tablespoon three times a day with meals

## 2017-09-08 NOTE — Progress Notes (Signed)
Sherry Dunn    [id number]    Dec 20, 1970  Primary Care Physician:Khan, Tamera Reason, MD  Referring Physician: Lise Auer, MD [address] [place], [place] [address]  Chief complaint: Dysphagia, heartburn, constipation alternating with diarrhea, fecal incontinence, bloating, nausea  HPI: 47 year old female here for new patient visit with multiple GI complaints as listed above.  She complains of chronic heartburn and also burning sensation in her stomach.  She was prescribed omeprazole daily but takes it on and off as needed, does not think it is helping with her symptoms.  She feels its making her stomach burn more when she takes omeprazole.  She has nausea almost daily.  No vomiting.  She has dysphagia to solids, liquids and also pills.  Every time she swallows, feels it gets hung up in her throat.  She has alternating constipation with diarrhea, with no bowel movement for 2-3 days followed by bowel movements 5 or 6 times daily which can last for the next 3-4 days.  On some days especially from last 1-2 months she is having hard stool requiring digital disimpaction and suppositories to evacuate.  She had a difficult labor, episiotomy and use of forceps during childbirth.  Other surgical history positive for laparoscopy to evaluate for endometriosis to evaluate chronic left lower quadrant abdominal pain. S/p gallbladder surgery in 2015, worsening diarrhea, which improved to some extent with Welchol 1 tablets at night, works on most days.  She continues to have excessive gas and bloating Patient was very anxious with multiple complaints, was very difficult to obtain history and to keep her focused.  She kept going back and forth and was not very clear or forthcoming with her issues or concerns.  She kept saying that she is a mess from head to toe.    Outpatient Encounter Medications as of 09/08/2017  Medication Sig  . ALPRAZolam (XANAX) 0.5 MG tablet Take 0.5 mg by mouth 2 (two) times  daily.  . colesevelam (WELCHOL) 625 MG tablet Take 625 mg by mouth daily.  Marland Kitchen FLUoxetine (PROZAC) 20 MG capsule Take 20 mg by mouth daily.  . LamoTRIgine 50 MG TBDP Take by mouth at bedtime as needed.  Marland Kitchen omeprazole (PRILOSEC) 40 MG capsule Take 40 mg by mouth daily.  . ondansetron (ZOFRAN) 4 MG tablet Take 1 tablet (4 mg total) by mouth every 8 (eight) hours as needed for nausea or vomiting.  Marland Kitchen tiZANidine (ZANAFLEX) 2 MG tablet Take by mouth every 6 (six) hours as needed for muscle spasms.  . Topiramate (TROKENDI XR PO) Take 100 mg by mouth daily.  . traZODone (DESYREL) 50 MG tablet Take 50 mg by mouth at bedtime.  Marland Kitchen zolmitriptan (ZOMIG) 5 MG tablet Take 5 mg by mouth as needed for migraine.  . [DISCONTINUED] oxyCODONE-acetaminophen (PERCOCET) 7.5-325 MG per tablet Take 1 tablet by mouth every 4 (four) hours as needed for severe pain.  . [DISCONTINUED] topiramate (TOPAMAX) 100 MG tablet Take 100 mg by mouth at bedtime.   No facility-administered encounter medications on file as of 09/08/2017.     Allergies as of 09/08/2017 - Review Complete 09/08/2017  Allergen Reaction Noted  . Flagyl [metronidazole] Hives 03/20/2015  . Sulfa antibiotics Hives 03/20/2015    Past Medical History:  Diagnosis Date  . Anxiety   . Headache   . PONV (postoperative nausea and vomiting)    nausea only   . Vaginal delivery 2001    Past Surgical History:  Procedure Laterality Date  . CHOLECYSTECTOMY    . DIAGNOSTIC LAPAROSCOPY    . LAPAROSCOPIC BILATERAL SALPINGECTOMY Bilateral 03/24/2015   Procedure: LAPAROSCOPIC BILATERAL SALPINGECTOMY;  Surgeon: Genia Del, MD;  Location: [facility];  Service: Gynecology;  Laterality: Bilateral;  . ROBOTIC ASSISTED TOTAL HYSTERECTOMY N/A 03/24/2015   Procedure: ROBOTIC ASSISTED TOTAL HYSTERECTOMY WITH BILATERAL SALPINGECTOMY, FULGUATION OF PELVIC ENDOMETERIOSIS;  Surgeon: Genia Del, MD;  Location: [facility];  Service: Gynecology;  Laterality: N/A;  . WISDOM TOOTH  EXTRACTION      Family History  Problem Relation Age of Onset  . Diabetes Mother   . Irritable bowel syndrome Mother   . Diabetes Father   . Lung cancer Paternal Grandfather   . Lung cancer Paternal Uncle   . Throat cancer Paternal Uncle     Social History   Socioeconomic History  . Marital status: Married    Spouse name: Not on file  . Number of children: Not on file  . Years of education: Not on file  . Highest education level: Not on file  Social Needs  . Financial resource strain: Not on file  . Food insecurity - worry: Not on file  . Food insecurity - inability: Not on file  . Transportation needs - medical: Not on file  . Transportation needs - non-medical: Not on file  Occupational History  . Not on file  Tobacco Use  . Smoking status: Never Smoker  . Smokeless tobacco: Never Used  Substance and Sexual Activity  . Alcohol use: No  . Drug use: No  . Sexual activity: Yes  Other Topics Concern  . Not on file  Social History Narrative  . Not on file      Review of systems: Review of Systems  Constitutional: Negative for fever and chills.  HENT: Negative.   Eyes: Negative for blurred vision.  Respiratory: Negative for cough, shortness of breath and wheezing.   Cardiovascular: Negative for chest pain and palpitations.  Gastrointestinal: as per HPI Genitourinary: Negative for dysuria, urgency, frequency and hematuria.  Musculoskeletal: Negative for myalgias, back pain and joint pain.  Skin: Negative for itching and rash.  Neurological: Negative for dizziness, tremors, focal weakness, seizures and loss of consciousness.  Endo/Heme/Allergies: Positive for seasonal allergies.  Psychiatric/Behavioral: Negative for depression, suicidal ideas and hallucinations.  All other systems reviewed and are negative.   Physical Exam: Vitals:   09/08/17 0925  BP: 102/66  Pulse: 68   Body mass index is 23.24 kg/m. Gen:      No acute distress HEENT:  EOMI, sclera  anicteric Neck:     No masses; no thyromegaly Lungs:    Clear to auscultation bilaterally; normal respiratory effort CV:         Regular rate and rhythm; no murmurs Abd:      + bowel sounds; soft, non-tender; no palpable masses, no distension Ext:    No edema; adequate peripheral perfusion Skin:      Warm and dry; no rash Neuro: alert and oriented x 3 Psych: normal mood and affect Rectal exam: Normal anal sphincter tone, tenderness and +anal fissure, no external hemorrhoids Anoscopy: Not performed  Data Reviewed:  Reviewed labs, radiology imaging, old records and pertinent past GI work up   Assessment and Plan/Recommendations:  47 year old female with complaints of heartburn, epigastric abdominal pain, nausea, constipation with alternating diarrhea, left lower quadrant abdominal pain, bloating and fecal incontinence Obtain abdominal x-ray to evaluate stool burden Patient likely has overflow diarrhea and fecal incontinence due  to incomplete rectal evacuation If evidence of increased stool burden, will do a bowel purge Start Benefiber 1 tablespoon 3 times daily with meals Evidence of anal fissure in left lateral position Advised patient to apply pea-sized amount of rectal nitroglycerin 0.125% 3 times daily Avoid inserting rectal suppositories or excessive straining or digital disimpaction Schedule for EGD to evaluate for dysphagia and possible esophageal dilation if needed The risks and benefits as well as alternatives of endoscopic procedure(s) have been discussed and reviewed. All questions answered. The patient agrees to proceed.    Iona Beard , MD [phone] Mon-Fri 8a-5p [phone] after 5p, weekends, holidays  CC: Lise Auer, MD

## 2017-09-12 ENCOUNTER — Ambulatory Visit (AMBULATORY_SURGERY_CENTER): Payer: 59 | Admitting: Gastroenterology

## 2017-09-12 ENCOUNTER — Encounter: Payer: Self-pay | Admitting: Gastroenterology

## 2017-09-12 VITALS — BP 104/70 | HR 70 | Temp 97.7°F | Resp 14 | Ht 64.0 in | Wt 135.0 lb

## 2017-09-12 DIAGNOSIS — R1084 Generalized abdominal pain: Secondary | ICD-10-CM | POA: Diagnosis not present

## 2017-09-12 DIAGNOSIS — K2950 Unspecified chronic gastritis without bleeding: Secondary | ICD-10-CM | POA: Diagnosis not present

## 2017-09-12 DIAGNOSIS — R1310 Dysphagia, unspecified: Secondary | ICD-10-CM | POA: Diagnosis present

## 2017-09-12 DIAGNOSIS — R140 Abdominal distension (gaseous): Secondary | ICD-10-CM | POA: Diagnosis not present

## 2017-09-12 DIAGNOSIS — R110 Nausea: Secondary | ICD-10-CM | POA: Diagnosis not present

## 2017-09-12 DIAGNOSIS — K219 Gastro-esophageal reflux disease without esophagitis: Secondary | ICD-10-CM | POA: Diagnosis not present

## 2017-09-12 MED ORDER — SODIUM CHLORIDE 0.9 % IV SOLN: 500.0000 mL | Freq: Once | INTRAVENOUS | Status: AC

## 2017-09-12 MED ORDER — OMEPRAZOLE-SODIUM BICARBONATE 40-1100 MG PO CAPS: 1.0000 | ORAL_CAPSULE | Freq: Every day | ORAL | 6 refills | Status: DC

## 2017-09-12 MED FILL — SODIUM BICARB 650 MG TABLET: 650 | 30 days supply | Qty: 60 | Fill #0

## 2017-09-12 MED FILL — OMEPRAZOLE DR 40 MG CAPSULE: 40 | 30 days supply | Qty: 30 | Fill #0

## 2017-09-12 NOTE — Op Note (Signed)
[facility] Patient Name: Sherry Dunn Procedure Date: 09/12/2017 11:22 AM MRN: [id number] Endoscopist: Napoleon Form , MD Age: 47 Referring MD:  Date of Birth: Jul 24, 1971 Gender: Female Account #: [id number] Procedure:                Upper GI endoscopy Indications:              Dysphagia, Generalized abdominal pain, Dyspepsia Medicines:                Monitored Anesthesia Care Procedure:                Pre-Anesthesia Assessment:                           - Prior to the procedure, a History and Physical                            was performed, and patient medications and                            allergies were reviewed. The patient's tolerance of                            previous anesthesia was also reviewed. The risks                            and benefits of the procedure and the sedation                            options and risks were discussed with the patient.                            All questions were answered, and informed consent                            was obtained. Prior Anticoagulants: The patient has                            taken no previous anticoagulant or antiplatelet                            agents. ASA Grade Assessment: II - A patient with                            mild systemic disease. After reviewing the risks                            and benefits, the patient was deemed in                            satisfactory condition to undergo the procedure.                           After obtaining informed consent, the endoscope was  passed under direct vision. Throughout the                            procedure, the patient's blood pressure, pulse, and                            oxygen saturations were monitored continuously. The                            Model GIF-[id number]) scope was introduced                            through the mouth, and advanced to the second part   of duodenum. The upper GI endoscopy was                            accomplished without difficulty. The patient                            tolerated the procedure well. Scope In: Scope Out: Findings:                 Esophagogastric landmarks were identified: the                            Z-line was found at 38 cm and the gastroesophageal                            junction was found at 38 cm from the incisors.                           No endoscopic abnormality was evident in the                            esophagus to explain the patient's complaint of                            dysphagia.                           Scattered moderate inflammation with hemorrhage                            characterized by adherent blood, congestion                            (edema), erosions, granularity and mucus was found                            in the stomach. Biopsies were taken with a cold                            forceps for Helicobacter pylori testing.                           The examined  duodenum was normal. Complications:            No immediate complications. Estimated Blood Loss:     Estimated blood loss was minimal. Impression:               - Esophagogastric landmarks identified.                           - No endoscopic esophageal abnormality to explain                            patient's dysphagia.                           - Erosive gastritis with hemorrhage. Biopsied.                           - Normal examined duodenum. Recommendation:           - Patient has a contact number available for                            emergencies. The signs and symptoms of potential                            delayed complications were discussed with the                            patient. Return to normal activities tomorrow.                            Written discharge instructions were provided to the                            patient.                           - Resume previous diet.                            - Continue present medications.                           - Await pathology results.                           - Follow an antireflux regimen.                           - Use Zegerid (omeprazole+bicarb) 20 mg tab by                            mouth daily X 3 months.                           - Return to GI office in 3 months. Napoleon Form, MD 09/12/2017 11:37:25 AM This report has been signed electronically.

## 2017-09-12 NOTE — Progress Notes (Signed)
Called to room to assist during endoscopic procedure.  Patient ID and intended procedure confirmed with present staff. Received instructions for my participation in the procedure from the performing physician.  

## 2017-09-12 NOTE — Patient Instructions (Signed)
YOU HAD AN ENDOSCOPIC PROCEDURE TODAY AT THE [facility]:   Refer to the procedure report that was given to you for any specific questions about what was found during the examination.  If the procedure report does not answer your questions, please call your gastroenterologist to clarify.  If you requested that your care partner not be given the details of your procedure findings, then the procedure report has been included in a sealed envelope for you to review at your convenience later.  YOU SHOULD EXPECT: Some feelings of bloating in the abdomen. Passage of more gas than usual.  Walking can help get rid of the air that was put into your GI tract during the procedure and reduce the bloating. If you had a lower endoscopy (such as a colonoscopy or flexible sigmoidoscopy) you may notice spotting of blood in your stool or on the toilet paper. If you underwent a bowel prep for your procedure, you may not have a normal bowel movement for a few days.  Please Note:  You might notice some irritation and congestion in your nose or some drainage.  This is from the oxygen used during your procedure.  There is no need for concern and it should clear up in a day or so.  SYMPTOMS TO REPORT IMMEDIATELY:   Following upper endoscopy (EGD)  Vomiting of blood or coffee ground material  New chest pain or pain under the shoulder blades  Painful or persistently difficult swallowing  New shortness of breath  Fever of 100F or higher  Black, tarry-looking stools  For urgent or emergent issues, a gastroenterologist can be reached at any hour by calling [phone].   DIET:  We do recommend a small meal at first, but then you may proceed to your regular diet.  Drink plenty of fluids but you should avoid alcoholic beverages for 24 hours.  ACTIVITY:  You should plan to take it easy for the rest of today and you should NOT DRIVE or use heavy machinery until tomorrow (because of the sedation medicines used  during the test).    FOLLOW UP: Our staff will call the number listed on your records the next business day following your procedure to check on you and address any questions or concerns that you may have regarding the information given to you following your procedure. If we do not reach you, we will leave a message.  However, if you are feeling well and you are not experiencing any problems, there is no need to return our call.  We will assume that you have returned to your regular daily activities without incident.  If any biopsies were taken you will be contacted by phone or by letter within the next 1-3 weeks.  Please call us at [phone] if you have not heard about the biopsies in 3 weeks.    SIGNATURES/CONFIDENTIALITY: You and/or your care partner have signed paperwork which will be entered into your electronic medical record.  These signatures attest to the fact that that the information above on your After Visit Summary has been reviewed and is understood.  Full responsibility of the confidentiality of this discharge information lies with you and/or your care-partner. 

## 2017-09-12 NOTE — Progress Notes (Signed)
Report given to PACU, vss 

## 2017-09-13 ENCOUNTER — Telehealth: Payer: Self-pay

## 2017-09-13 NOTE — Telephone Encounter (Signed)
Left message

## 2017-09-13 NOTE — Telephone Encounter (Signed)
  Follow up Call-  Call back number 09/12/2017  Post procedure Call Back phone  # [phone]  Permission to leave phone message Yes  Some recent data might be hidden     Patient questions:  Do you have a fever, pain , or abdominal swelling? No. Pain Score  0 *  Have you tolerated food without any problems? Yes.    Have you been able to return to your normal activities? Yes.    Do you have any questions about your discharge instructions: Diet   No. Medications  No. Follow up visit  No.  Do you have questions or concerns about your Care? No.  Actions: * If pain score is 4 or above: No action needed, pain <4.

## 2017-09-19 ENCOUNTER — Encounter: Payer: Self-pay | Admitting: Gastroenterology

## 2017-09-19 MED FILL — TROKENDI XR 100 MG CAPSULE: 100 | 30 days supply | Qty: 30 | Fill #3

## 2017-09-19 MED FILL — ALPRAZolam 1 MG TABS: 1 | 22 days supply | Qty: 45 | Fill #2

## 2017-10-10 MED FILL — lamoTRIgine 25 MG TABS: 25 | 90 days supply | Qty: 180 | Fill #0

## 2017-10-10 MED FILL — FLUoxetine HCL 20 MG CAPS: 20 | 90 days supply | Qty: 90 | Fill #0

## 2017-10-20 ENCOUNTER — Other Ambulatory Visit: Payer: Self-pay | Admitting: Gastroenterology

## 2017-10-20 MED FILL — TROKENDI XR 100 MG CAPSULE: 100 | 30 days supply | Qty: 30 | Fill #4

## 2017-10-20 MED FILL — OMEPRAZOLE DR 40 MG CAPSULE: 40 | 30 days supply | Qty: 30 | Fill #0

## 2017-10-20 MED FILL — SODIUM BICARB 650 MG TABLET: 650 | 30 days supply | Qty: 60 | Fill #0

## 2017-10-26 ENCOUNTER — Encounter: Payer: Self-pay | Admitting: Obstetrics & Gynecology

## 2017-11-07 MED FILL — WELCHOL 625 MG TABLET: 625 | 30 days supply | Qty: 120 | Fill #1

## 2017-11-07 MED FILL — ALPRAZolam 1 MG TABS: 1 | 22 days supply | Qty: 45 | Fill #3

## 2017-11-21 MED FILL — TROKENDI XR 100 MG CAPSULE: 100 | 30 days supply | Qty: 30 | Fill #5

## 2017-11-21 MED FILL — traZODone HCL 100 MG TABS: 100 | 90 days supply | Qty: 90 | Fill #1

## 2017-12-07 ENCOUNTER — Encounter: Payer: Self-pay | Admitting: Gastroenterology

## 2017-12-07 ENCOUNTER — Ambulatory Visit: Payer: 59 | Admitting: Gastroenterology

## 2017-12-07 VITALS — BP 110/76 | HR 68 | Ht 64.0 in | Wt 140.2 lb

## 2017-12-07 DIAGNOSIS — R140 Abdominal distension (gaseous): Secondary | ICD-10-CM | POA: Diagnosis not present

## 2017-12-07 DIAGNOSIS — K219 Gastro-esophageal reflux disease without esophagitis: Secondary | ICD-10-CM | POA: Diagnosis not present

## 2017-12-07 DIAGNOSIS — K2900 Acute gastritis without bleeding: Secondary | ICD-10-CM | POA: Diagnosis not present

## 2017-12-07 DIAGNOSIS — K5909 Other constipation: Secondary | ICD-10-CM | POA: Diagnosis not present

## 2017-12-07 MED ORDER — LINACLOTIDE 72 MCG PO CAPS: 72.0000 ug | ORAL_CAPSULE | Freq: Every day | ORAL | 3 refills | Status: DC

## 2017-12-07 MED FILL — LINZESS 72 MCG CAPSULE: 72 | 30 days supply | Qty: 30 | Fill #0

## 2017-12-07 MED FILL — SODIUM BICARB 650 MG TABLET: 650 | 30 days supply | Qty: 120 | Fill #0

## 2017-12-07 MED FILL — OMEPRAZOLE DR 40 MG CAPSULE: 40 | 30 days supply | Qty: 60 | Fill #0

## 2017-12-07 NOTE — Progress Notes (Signed)
Sherry Dunn    [id number]    Oct 10, 1970  Primary Care Physician:Khan, Tamera Reason, MD  Referring Physician: Lise Auer, MD [address] [place], [place] [address]  Chief complaint: Heartburn, bloating, epigastric and right upper quadrant abdominal pain, constipation  HPI: 47 year old female here for follow-up visit Zegrid hasnt changed much, has to take TUMS atleast 3-4 times a week.  When she eats or drinks something cold helps Tomato sauce, Onions, pepper, garlic and red meat makes her symptoms worse Feels full and bloated with gas.  She is having more episodes of constipation, no longer has diarrhea.  Diarrhea and fecal urgency has improved with dietary changes and WelChol Some weeks better than other, she has not had a BM in 4-5 days and when she has a bowel movement usually ends up having  multiple soft to liquid bowel movements Bowel purge with miralax and gatorade helped but feels that she is backing up again Benefiber made bloating worse   Outpatient Encounter Medications as of 12/07/2017  Medication Sig  . ALPRAZolam (XANAX) 0.5 MG tablet Take 0.5 mg by mouth 2 (two) times daily.  . AMBULATORY NON FORMULARY MEDICATION Medication Name: Nitroglycerin 0.125% three times a day per rectum use pea sized amount  . colesevelam (WELCHOL) 625 MG tablet Take 625 mg by mouth daily.  Marland Kitchen FLUoxetine (PROZAC) 20 MG capsule Take 20 mg by mouth daily.  . LamoTRIgine 50 MG TBDP Take by mouth at bedtime as needed.  Marland Kitchen omeprazole (PRILOSEC) 40 MG capsule TAKE 1 CAPSULE BY MOUTH ONCE DAILY BEFORE BREAKFAST  . ondansetron (ZOFRAN) 4 MG tablet Take 1 tablet (4 mg total) by mouth every 8 (eight) hours as needed for nausea or vomiting.  . sodium bicarbonate 650 MG tablet TAKE 2 TABLET BY MOUTH DAILY BEFORE BREAKFAST  . tiZANidine (ZANAFLEX) 2 MG tablet Take by mouth every 6 (six) hours as needed for muscle spasms.  . Topiramate (TROKENDI XR PO) Take 100 mg by mouth daily.  . traZODone  (DESYREL) 50 MG tablet Take 50 mg by mouth at bedtime.  Marland Kitchen zolmitriptan (ZOMIG) 5 MG tablet Take 5 mg by mouth as needed for migraine.  . [DISCONTINUED] omeprazole (PRILOSEC) 40 MG capsule Take 40 mg by mouth daily.   Facility-Administered Encounter Medications as of 12/07/2017  Medication  . 0.9 %  sodium chloride infusion    Allergies as of 12/07/2017 - Review Complete 12/07/2017  Allergen Reaction Noted  . Flagyl [metronidazole] Hives 03/20/2015  . Sulfa antibiotics Hives 03/20/2015    Past Medical History:  Diagnosis Date  . Anxiety   . Headache   . PONV (postoperative nausea and vomiting)    nausea only   . Vaginal delivery 2001    Past Surgical History:  Procedure Laterality Date  . CHOLECYSTECTOMY    . DIAGNOSTIC LAPAROSCOPY    . LAPAROSCOPIC BILATERAL SALPINGECTOMY Bilateral 03/24/2015   Procedure: LAPAROSCOPIC BILATERAL SALPINGECTOMY;  Surgeon: Genia Del, MD;  Location: [facility];  Service: Gynecology;  Laterality: Bilateral;  . ROBOTIC ASSISTED TOTAL HYSTERECTOMY N/A 03/24/2015   Procedure: ROBOTIC ASSISTED TOTAL HYSTERECTOMY WITH BILATERAL SALPINGECTOMY, FULGUATION OF PELVIC ENDOMETERIOSIS;  Surgeon: Genia Del, MD;  Location: [facility];  Service: Gynecology;  Laterality: N/A;  . WISDOM TOOTH EXTRACTION      Family History  Problem Relation Age of Onset  . Diabetes Mother   . Irritable bowel syndrome Mother   . Diabetes Father   . Lung cancer Paternal  Grandfather   . Lung cancer Paternal Uncle   . Throat cancer Paternal Uncle     Social History   Socioeconomic History  . Marital status: Married    Spouse name: Not on file  . Number of children: Not on file  . Years of education: Not on file  . Highest education level: Not on file  Occupational History  . Occupation: Nurse  Social Needs  . Financial resource strain: Not on file  . Food insecurity:    Worry: Not on file    Inability: Not on file  . Transportation needs:    Medical: Not on file      Non-medical: Not on file  Tobacco Use  . Smoking status: Never Smoker  . Smokeless tobacco: Never Used  Substance and Sexual Activity  . Alcohol use: No  . Drug use: No  . Sexual activity: Yes  Lifestyle  . Physical activity:    Days per week: Not on file    Minutes per session: Not on file  . Stress: Not on file  Relationships  . Social connections:    Talks on phone: Not on file    Gets together: Not on file    Attends religious service: Not on file    Active member of club or organization: Not on file    Attends meetings of clubs or organizations: Not on file    Relationship status: Not on file  . Intimate partner violence:    Fear of current or ex partner: Not on file    Emotionally abused: Not on file    Physically abused: Not on file    Forced sexual activity: Not on file  Other Topics Concern  . Not on file  Social History Narrative  . Not on file      Review of systems: Review of Systems  Constitutional: Negative for fever and chills.  HENT: Positive for sinus problem and ringing in ears Eyes: Negative for blurred vision.  Respiratory: Negative for cough, shortness of breath and wheezing.   Cardiovascular: Negative for chest pain and palpitations.  Gastrointestinal: as per HPI Genitourinary: Negative for dysuria, urgency, frequency and hematuria.  Musculoskeletal: Positive for myalgias, back pain and joint pain.  Skin: Negative for itching and rash.  Neurological: Negative for  tremors, focal weakness, seizures and loss of consciousness.  Positive for frequent headaches and dizziness Endo/Heme/Allergies: Positive for seasonal allergies.  Psychiatric/Behavioral: Negative for suicidal ideas and hallucinations.  Positive for irritability, depression and anxiety All other systems reviewed and are negative.   Physical Exam: Vitals:   12/07/17 1457  BP: 110/76  Pulse: 68   Body mass index is 24.07 kg/m. Gen:      No acute distress HEENT:  EOMI, sclera  anicteric Neck:     No masses; no thyromegaly Lungs:    Clear to auscultation bilaterally; normal respiratory effort CV:         Regular rate and rhythm; no murmurs Abd:      + bowel sounds; soft, non-tender; no palpable masses, no distension Ext:    No edema; adequate peripheral perfusion Skin:      Warm and dry; no rash Neuro: alert and oriented x 3 Psych: normal mood and affect  Data Reviewed:  Reviewed labs, radiology imaging, old records and pertinent past GI work up   Assessment and Plan/Recommendations:  47 year old female with GERD, gastritis and irritable bowel syndrome with alternating constipation and diarrhea  GERD and gastritis Continue with Zegrid twice  daily before breakfast and dinner Small frequent meals Avoid NSAIDs Discussed antireflux measures Okay to use Gaviscon as needed for breakthrough heartburn and epigastric discomfort up to 3 times a day  Irritable bowel syndrome with alternating constipation and diarrhea Currently she has predominant constipation symptoms Start low-dose linzess 72 mcg daily Increase fluid intake Continue WelChol at bedtime  25 minutes was spent face-to-face with the patient. Greater than 50% of the time used for counseling as well as treatment plan and follow-up. She had multiple questions which were answered to her satisfaction  K. Scherry Ran , MD [phone]    CC: Lise Auer, MD

## 2017-12-07 NOTE — Patient Instructions (Addendum)
We have sent Linzess to your pharmacy  Continue Welchol at bedtime  Increase Zegerid to twice a day for 2 months will send in refills ( Your Zegerid is sent in as Prilosec twice daily and separate rx of sodium bicarbonate 2 twice a day) for 2 months    Take Gaviscon 1 tablet after meals as needed  If you are age 49 or older, your body mass index should be between 23-30. Your Body mass index is 24.07 kg/m. If this is out of the aforementioned range listed, please consider follow up with your Primary Care Provider.  If you are age 57 or younger, your body mass index should be between 19-25. Your Body mass index is 24.07 kg/m. If this is out of the aformentioned range listed, please consider follow up with your Primary Care Provider.

## 2017-12-26 MED FILL — TROKENDI XR 100 MG CAPSULE: 100 | 30 days supply | Qty: 30 | Fill #6

## 2018-01-05 MED FILL — lamoTRIgine 25 MG TABS: 25 | 90 days supply | Qty: 180 | Fill #1

## 2018-01-05 MED FILL — FLUoxetine HCL 20 MG CAPS: 20 | 90 days supply | Qty: 90 | Fill #1

## 2018-01-11 DIAGNOSIS — F4323 Adjustment disorder with mixed anxiety and depressed mood: Secondary | ICD-10-CM | POA: Diagnosis not present

## 2018-01-11 DIAGNOSIS — K219 Gastro-esophageal reflux disease without esophagitis: Secondary | ICD-10-CM | POA: Diagnosis not present

## 2018-01-11 DIAGNOSIS — Z6823 Body mass index (BMI) 23.0-23.9, adult: Secondary | ICD-10-CM | POA: Diagnosis not present

## 2018-01-11 DIAGNOSIS — G43909 Migraine, unspecified, not intractable, without status migrainosus: Secondary | ICD-10-CM | POA: Diagnosis not present

## 2018-01-12 MED FILL — ALPRAZolam 1 MG TABS: 1 | 22 days supply | Qty: 45 | Fill #0

## 2018-01-13 MED FILL — EMGALITY 120 MG/ML SOAJ: 120 | 30 days supply | Qty: 1 | Fill #0

## 2018-01-17 MED FILL — FROVATRIPTAN SUCC 2.5 MG TA: 2.5 | 30 days supply | Qty: 9 | Fill #0

## 2018-01-25 ENCOUNTER — Encounter: Payer: Self-pay | Admitting: Obstetrics & Gynecology

## 2018-01-25 ENCOUNTER — Ambulatory Visit (INDEPENDENT_AMBULATORY_CARE_PROVIDER_SITE_OTHER): Payer: 59 | Admitting: Obstetrics & Gynecology

## 2018-01-25 VITALS — BP 140/90 | Ht 63.75 in | Wt 144.0 lb

## 2018-01-25 DIAGNOSIS — Z90710 Acquired absence of both cervix and uterus: Secondary | ICD-10-CM

## 2018-01-25 DIAGNOSIS — R61 Generalized hyperhidrosis: Secondary | ICD-10-CM | POA: Diagnosis not present

## 2018-01-25 DIAGNOSIS — Z01419 Encounter for gynecological examination (general) (routine) without abnormal findings: Secondary | ICD-10-CM

## 2018-01-25 MED FILL — TROKENDI XR 100 MG CAPSULE: 100 | 30 days supply | Qty: 30 | Fill #0

## 2018-01-25 NOTE — Progress Notes (Signed)
Sherry Dunn [birth year] [id number]   History:    47 y.o. G2P1A1L1  Remarried.  Daughter 39 yo, just graduated HS.  Going to [employer] first.  RP:  Established patient presenting for annual gyn exam   HPI: S/P Robotic TLH/Bilateral Salpingectomy, fulguration of pelvic Endometriosis 03/24/2015.  No abdominopelvic pain.  Complains of worsening night sweats, mild hot flushes and decreased libido.  No pain with intercourse.  Urine and bowel movements normal.  Breasts normal.  Health labs with family physician.  Body mass index 24.91.  Past medical history,surgical history, family history and social history were all reviewed and documented in the EPIC chart.  Gynecologic History Patient's last menstrual period was 03/10/2015. Contraception: status post hysterectomy Last Pap: 12/2013. Results were: Negative, HPV HR neg Last mammogram: 2016. Results were: Negative Bone Density: Never Colonoscopy: Never  Obstetric History OB History  Gravida Para Term Preterm AB Living  2 1     1 1   SAB TAB Ectopic Multiple Live Births  1            # Outcome Date GA Lbr Len/2nd Weight Sex Delivery Anes PTL Lv  2 SAB           1 Para              ROS: A ROS was performed and pertinent positives and negatives are included in the history.  GENERAL: No fevers or chills. HEENT: No change in vision, no earache, sore throat or sinus congestion. NECK: No pain or stiffness. CARDIOVASCULAR: No chest pain or pressure. No palpitations. PULMONARY: No shortness of breath, cough or wheeze. GASTROINTESTINAL: No abdominal pain, nausea, vomiting or diarrhea, melena or bright red blood per rectum. GENITOURINARY: No urinary frequency, urgency, hesitancy or dysuria. MUSCULOSKELETAL: No joint or muscle pain, no back pain, no recent trauma. DERMATOLOGIC: No rash, no itching, no lesions. ENDOCRINE: No polyuria, polydipsia, no heat or cold intolerance. No recent change in weight. HEMATOLOGICAL: No anemia or easy bruising or  bleeding. NEUROLOGIC: No headache, seizures, numbness, tingling or weakness. PSYCHIATRIC: No depression, no loss of interest in normal activity or change in sleep pattern.     Exam:   BP 140/90   Ht 5' 3.75" (1.619 m)   Wt 144 lb (65.3 kg)   LMP 03/10/2015   BMI 24.91 kg/m   Body mass index is 24.91 kg/m.  General appearance : Well developed well nourished female. No acute distress HEENT: Eyes: no retinal hemorrhage or exudates,  Neck supple, trachea midline, no carotid bruits, no thyroidmegaly Lungs: Clear to auscultation, no rhonchi or wheezes, or rib retractions  Heart: Regular rate and rhythm, no murmurs or gallops Breast:Examined in sitting and supine position were symmetrical in appearance, no palpable masses or tenderness,  no skin retraction, no nipple inversion, no nipple discharge, no skin discoloration, no axillary or supraclavicular lymphadenopathy Abdomen: no palpable masses or tenderness, no rebound or guarding Extremities: no edema or skin discoloration or tenderness  Pelvic: Vulva: Normal             Vagina: No gross lesions or discharge.  Pap reflex done.  Cervix/Uterus absent  Adnexa  Without masses or tenderness  Anus: Normal   Assessment/Plan:  47 y.o. female for annual exam   1. Well female exam with routine gynecological exam Gynecologic exam status post total hysterectomy.  Pap reflex done on the vaginal vault.  Breast exam normal.  Needs to schedule screening mammogram now.  Health labs with family physician.  Good body mass index at 24.91. - Pap IG w/ reflex to HPV when ASC-U  2. S/P total hysterectomy  3. Night sweats Progressively increased vasomotor symptoms probably associated with getting into menopause.  Recommend vitamin D supplements, calcium rich nutrition and regular weightbearing physical activity.  If menopause confirmed, will start on HRT with Estradiol 0.05 patch weekly.  S/P Total Hysterectomy. - [facility]   Genia Del MD, 4:08 PM  01/25/2018

## 2018-01-26 ENCOUNTER — Telehealth: Payer: Self-pay

## 2018-01-26 LAB — FOLLICLE STIMULATING HORMONE: FSH: 130.8 m[IU]/mL — ABNORMAL HIGH

## 2018-01-26 NOTE — Telephone Encounter (Signed)
Patient informed. She said at visit she discussed with you that she is having mood swings, memory issues, just does not feel like herself. Also, night sweats but not really bad.  She said you told her you could prescribe an estrogen patch if menopausal. She has had hysterectomy.  She would like to try that if you will prescribe.

## 2018-01-26 NOTE — Telephone Encounter (Signed)
-----   Message from Genia Del, MD sent at 01/26/2018  1:06 PM EDT ----- FSH high, menopause confirmed.

## 2018-01-27 LAB — PAP IG W/ RFLX HPV ASCU

## 2018-01-27 MED ORDER — ESTRADIOL 0.075 MG/24HR TD PTWK: 0.0750 mg | MEDICATED_PATCH | TRANSDERMAL | 4 refills | Status: DC

## 2018-01-27 NOTE — Telephone Encounter (Signed)
Left message on voice mail that sending Rx we spoke about earlier this week. Directions will be on it. I did not have permission to leave detailed message so I did not. Asked her to call me if any questions.  Rx sent.

## 2018-01-27 NOTE — Telephone Encounter (Signed)
Yes, please send prescription for Estradiol patch 0.075 weekly.  #12, refill x 4.

## 2018-01-29 ENCOUNTER — Encounter: Payer: Self-pay | Admitting: Obstetrics & Gynecology

## 2018-01-29 NOTE — Patient Instructions (Signed)
1. Well female exam with routine gynecological exam Gynecologic exam status post total hysterectomy.  Pap reflex done on the vaginal vault.  Breast exam normal.  Needs to schedule screening mammogram now.  Health labs with family physician.  Good body mass index at 24.91. - Pap IG w/ reflex to HPV when ASC-U  2. S/P total hysterectomy  3. Night sweats Progressively increased vasomotor symptoms probably associated with getting into menopause.  Recommend vitamin D supplements, calcium rich nutrition and regular weightbearing physical activity.  If menopause confirmed, will start on HRT with Estradiol 0.05 patch weekly.  S/P Total Hysterectomy. - [facility]  Sherry Dunn, it was a pleasure seeing you today!  I will inform you of your results as soon as they are available.

## 2018-02-22 MED FILL — OMEPRAZOLE 40 MG CPDR: 40 | 30 days supply | Qty: 60 | Fill #1

## 2018-02-22 MED FILL — SODIUM BICARB 650 MG TABLET: 650 | 30 days supply | Qty: 120 | Fill #1

## 2018-02-22 MED FILL — TROKENDI XR 100 MG CAPSULE: 100 | 30 days supply | Qty: 30 | Fill #1

## 2018-02-23 MED FILL — EMGALITY 120 MG/ML SOAJ: 120 | 30 days supply | Qty: 1 | Fill #1

## 2018-03-01 MED FILL — ALPRAZolam 1 MG TABS: 1 | 22 days supply | Qty: 45 | Fill #1

## 2018-03-02 MED FILL — COLESEVELAM HCL 625 MG TABS: 625 | 30 days supply | Qty: 120 | Fill #0

## 2018-03-22 MED FILL — EMGALITY 120 MG/ML SOAJ: 120 | 30 days supply | Qty: 1 | Fill #2

## 2018-03-22 MED FILL — traZODone HCL 100 MG TABS: 100 | 90 days supply | Qty: 90 | Fill #2

## 2018-03-28 MED FILL — ONDANSETRON HCL 8 MG TABLET: 8 | 5 days supply | Qty: 10 | Fill #1

## 2018-03-28 MED FILL — TROKENDI XR 100 MG CAPSULE: 100 | 30 days supply | Qty: 30 | Fill #2

## 2018-03-28 MED FILL — FROVATRIPTAN SUCC 2.5 MG TA: 2.5 | 30 days supply | Qty: 9 | Fill #1

## 2018-04-03 MED FILL — lamoTRIgine 25 MG TABS: 25 | 90 days supply | Qty: 180 | Fill #2

## 2018-04-04 MED FILL — FLUoxetine HCL 20 MG CAPS: 20 | 90 days supply | Qty: 90 | Fill #2

## 2018-04-27 MED FILL — EMGALITY 120 MG/ML SOAJ: 120 | 30 days supply | Qty: 1 | Fill #3

## 2018-04-27 MED FILL — TROKENDI XR 100 MG CAPSULE: 100 | 30 days supply | Qty: 30 | Fill #3

## 2018-04-28 MED FILL — ALPRAZolam 1 MG TABS: 1 | 22 days supply | Qty: 45 | Fill #2

## 2018-05-03 ENCOUNTER — Ambulatory Visit: Payer: 59 | Admitting: Obstetrics & Gynecology

## 2018-05-03 ENCOUNTER — Encounter: Payer: Self-pay | Admitting: Obstetrics & Gynecology

## 2018-05-03 VITALS — BP 126/70

## 2018-05-03 DIAGNOSIS — N952 Postmenopausal atrophic vaginitis: Secondary | ICD-10-CM

## 2018-05-03 DIAGNOSIS — N951 Menopausal and female climacteric states: Secondary | ICD-10-CM

## 2018-05-03 MED ORDER — ESTRADIOL 0.1 MG/GM VA CREA: 0.2500 | TOPICAL_CREAM | VAGINAL | 4 refills | Status: DC

## 2018-05-03 MED ORDER — ESTRADIOL 0.05 MG/24HR TD PTTW: 1.0000 | MEDICATED_PATCH | TRANSDERMAL | 4 refills | Status: DC

## 2018-05-03 MED FILL — ESTRADIOL 0.1 MG/GM CREA: 0.1 | 90 days supply | Qty: 43 | Fill #0

## 2018-05-03 MED FILL — ESTRADIOL 0.05 MG PATCH: 0.05 | 84 days supply | Qty: 24 | Fill #0

## 2018-05-03 NOTE — Progress Notes (Signed)
    Sherry Dunn 02/16/1971 [id number]        47 y.o.  G2P0011 Remarried.  Daughter 57 yo.  RP: Menopausal symptoms of vaginal dryness, hot flushes/night sweats and insomnia  HPI: Vasomotor Sxs of Menopause present at Annual/Gyn visit in 01/2018: S/P Robotic TLH/Bilateral Salpingectomy, fulguration of pelvic Endometriosis 03/24/2015.  No abdominopelvic pain.  Complains of worsening night sweats, mild hot flushes and decreased libido. FSH done on 01/25/2018 at 130.8.  Now vasomotor symptoms are becoming more severe and prevent patient from sleeping.  She is also developing vaginal dryness and pain with IC.  No h/o blood clot/stroke.  No fam h/o breast Ca.   OB History  Gravida Para Term Preterm AB Living  2 1     1 1   SAB TAB Ectopic Multiple Live Births  1            # Outcome Date GA Lbr Len/2nd Weight Sex Delivery Anes PTL Lv  2 SAB           1 Para             Past medical history,surgical history, problem list, medications, allergies, family history and social history were all reviewed and documented in the EPIC chart.   Directed ROS with pertinent positives and negatives documented in the history of present illness/assessment and plan.  Exam:  Vitals:   05/03/18 1438  BP: 126/70   General appearance:  Normal   Assessment/Plan:  47 y.o. G2P0011   1. Menopausal syndrome Very symptomatic menopause with vasomotor symptoms including hot flushes and night sweats every day, with secondary insomnia.  Patient is status post total hysterectomy.  No contraindication to hormone replacement therapy and many benefits reviewed with patient.  Low risk of blood clots/stroke and eventual small increased risk of breast cancer after 10 years of use reviewed with patient.  Decision to start on the estradiol patch 0.05 to change twice a week.  Usage reviewed and prescription sent to pharmacy.  2. Post-menopausal atrophic vaginitis Patient is experiencing vaginal dryness and pain with intercourse  associated with menopause.  Will treat locally with Estrace cream a quarter of an applicator twice a week.  Patient may add a small amount of cream at the vulva/entrance of the vagina.  Prescription sent to pharmacy.  Other orders - estradiol (VIVELLE-DOT) 0.05 MG/24HR patch; Place 1 patch (0.05 mg total) onto the skin 2 (two) times a week. - estradiol (ESTRACE VAGINAL) 0.1 MG/GM vaginal cream; Place 0.25 Applicatorfuls vaginally 2 (two) times a week.  Counseling on above issues and coordination of care more than 50% for 25 minutes.  54 MD, 3:27 PM 05/03/2018

## 2018-05-06 ENCOUNTER — Encounter: Payer: Self-pay | Admitting: Obstetrics & Gynecology

## 2018-05-06 NOTE — Patient Instructions (Signed)
1. Menopausal syndrome Very symptomatic menopause with vasomotor symptoms including hot flushes and night sweats every day, with secondary insomnia.  Patient is status post total hysterectomy.  No contraindication to hormone replacement therapy and many benefits reviewed with patient.  Low risk of blood clots/stroke and eventual small increased risk of breast cancer after 10 years of use reviewed with patient.  Decision to start on the estradiol patch 0.05 to change twice a week.  Usage reviewed and prescription sent to pharmacy.  2. Post-menopausal atrophic vaginitis Patient is experiencing vaginal dryness and pain with intercourse associated with menopause.  Will treat locally with Estrace cream a quarter of an applicator twice a week.  Patient may add a small amount of cream at the vulva/entrance of the vagina.  Prescription sent to pharmacy.  Other orders - estradiol (VIVELLE-DOT) 0.05 MG/24HR patch; Place 1 patch (0.05 mg total) onto the skin 2 (two) times a week. - estradiol (ESTRACE VAGINAL) 0.1 MG/GM vaginal cream; Place 0.25 Applicatorfuls vaginally 2 (two) times a week.  Lawson Fiscal, good seeing you today!

## 2018-05-24 MED FILL — SODIUM BICARB 650 MG TABLET: 650 | 30 days supply | Qty: 120 | Fill #2

## 2018-05-24 MED FILL — ALPRAZolam 1 MG TABS: 1 | 22 days supply | Qty: 45 | Fill #3

## 2018-05-24 MED FILL — TROKENDI XR 100 MG CAPSULE: 100 | 30 days supply | Qty: 30 | Fill #4

## 2018-05-24 MED FILL — OMEPRAZOLE 40 MG CPDR: 40 | 30 days supply | Qty: 60 | Fill #2

## 2018-05-24 MED FILL — EMGALITY 120 MG/ML SOAJ: 120 | 30 days supply | Qty: 1 | Fill #4

## 2018-06-02 DIAGNOSIS — Z1339 Encounter for screening examination for other mental health and behavioral disorders: Secondary | ICD-10-CM | POA: Diagnosis not present

## 2018-06-02 DIAGNOSIS — G43909 Migraine, unspecified, not intractable, without status migrainosus: Secondary | ICD-10-CM | POA: Diagnosis not present

## 2018-06-02 DIAGNOSIS — M7742 Metatarsalgia, left foot: Secondary | ICD-10-CM | POA: Diagnosis not present

## 2018-06-02 DIAGNOSIS — F4323 Adjustment disorder with mixed anxiety and depressed mood: Secondary | ICD-10-CM | POA: Diagnosis not present

## 2018-06-02 DIAGNOSIS — Z2821 Immunization not carried out because of patient refusal: Secondary | ICD-10-CM | POA: Diagnosis not present

## 2018-06-02 DIAGNOSIS — K219 Gastro-esophageal reflux disease without esophagitis: Secondary | ICD-10-CM | POA: Diagnosis not present

## 2018-06-02 DIAGNOSIS — Z6824 Body mass index (BMI) 24.0-24.9, adult: Secondary | ICD-10-CM | POA: Diagnosis not present

## 2018-06-02 MED FILL — CELECOXIB 200 MG CAPSULE: 200 | 90 days supply | Qty: 90 | Fill #0

## 2018-06-21 ENCOUNTER — Ambulatory Visit: Payer: 59 | Admitting: Sports Medicine

## 2018-06-21 ENCOUNTER — Encounter: Payer: Self-pay | Admitting: Sports Medicine

## 2018-06-21 ENCOUNTER — Ambulatory Visit (INDEPENDENT_AMBULATORY_CARE_PROVIDER_SITE_OTHER): Payer: 59

## 2018-06-21 ENCOUNTER — Other Ambulatory Visit: Payer: Self-pay

## 2018-06-21 VITALS — BP 122/76 | HR 67 | Resp 16 | Ht 64.0 in | Wt 142.0 lb

## 2018-06-21 DIAGNOSIS — M79672 Pain in left foot: Secondary | ICD-10-CM | POA: Diagnosis not present

## 2018-06-21 DIAGNOSIS — M722 Plantar fascial fibromatosis: Secondary | ICD-10-CM | POA: Diagnosis not present

## 2018-06-21 DIAGNOSIS — M19079 Primary osteoarthritis, unspecified ankle and foot: Secondary | ICD-10-CM

## 2018-06-21 DIAGNOSIS — M779 Enthesopathy, unspecified: Secondary | ICD-10-CM

## 2018-06-21 DIAGNOSIS — M069 Rheumatoid arthritis, unspecified: Secondary | ICD-10-CM

## 2018-06-21 DIAGNOSIS — M79671 Pain in right foot: Secondary | ICD-10-CM | POA: Diagnosis not present

## 2018-06-21 MED ORDER — METHYLPREDNISOLONE 4 MG PO TBPK: ORAL_TABLET | ORAL | 0 refills | Status: DC

## 2018-06-21 MED FILL — METHYLPREDNISOLONE 4 MG TAB: 4 | 6 days supply | Qty: 21 | Fill #0

## 2018-06-21 NOTE — Progress Notes (Signed)
   Subjective:    Patient ID: Sherry Dunn, female    DOB: [birth year], 47 y.o.   MRN: [id number]  HPI    Review of Systems  Musculoskeletal: Positive for arthralgias and myalgias.  All other systems reviewed and are negative.      Objective:   Physical Exam        Assessment & Plan:

## 2018-06-21 NOTE — Progress Notes (Signed)
Subjective: Sherry Dunn is a 47 y.o. female patient presents to office with complaint of moderate heel pain on the left greater than right. 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  Patient states that she cannot remember which pain came first where there was pain in her heels or palms that her left big toe joint at the bottom surface states that there is pain especially with flexion of her toe joint on the left hand states that she does not recall any type of injury or trauma to the joint states that the left big toe pain has been getting worse slowly over the last 6 months states that on Saturday she could not put any pressure or weight on her foot due to the pain being more intense states that there are certain shoes that aggravates it and standing long periods aggravates her pain and symptoms states that sometimes massage helps good supportive shoes and Aspercreme helps from time to time a patient also admits to other areas of joint pain like her ankles and knees states that she also hears popping in those areas when walking states that she was recently seen by her primary care doctor who started her on Celebrex but has not taken it regularly.  Patient denies any personal history of arthritis but does admit to a family history of arthritis patient denies warmth redness swelling or color change over the joints. Denies any other pedal complaints.   Review of Systems  Musculoskeletal: Positive for joint pain and myalgias.  All other systems reviewed and are negative.    Patient Active Problem List   Diagnosis Date Noted  . Hyperacusis 12/25/2015  . 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 Postoperative state 03/24/2015    Current Outpatient Medications on File Prior to Visit  Medication Sig Dispense Refill  . celecoxib  (CELEBREX) 200 MG capsule Take 200 mg by mouth 2 (two) times daily.    . cetirizine (ZYRTEC) 10 MG tablet Take 10 mg by mouth daily.    . colesevelam (WELCHOL) 625 MG tablet Take 625 mg by mouth daily.    Marland Kitchen estradiol (ESTRACE VAGINAL) 0.1 MG/GM vaginal cream Place 9.48 Applicatorfuls vaginally 2 (two) times a week. 42.5 g 4  . estradiol (VIVELLE-DOT) 0.05 MG/24HR patch Place 1 patch (0.05 mg total) onto the skin 2 (two) times a week. 24 patch 4  . FLUoxetine (PROZAC) 20 MG capsule Take 20 mg by mouth daily.    Marland Kitchen Galcanezumab-gnlm (EMGALITY) 120 MG/ML SOAJ Inject into the skin.    . LamoTRIgine 50 MG TBDP Take by mouth at bedtime as needed.    Marland Kitchen omeprazole (PRILOSEC) 40 MG capsule TAKE 1 CAPSULE BY MOUTH ONCE DAILY BEFORE BREAKFAST 30 capsule 0  . traZODone (DESYREL) 50 MG tablet Take 50 mg by mouth at bedtime.     Current Facility-Administered Medications on File Prior to Visit  Medication Dose Route Frequency Provider Last Rate Last Dose  . 0.9 %  sodium chloride infusion  500 mL Intravenous Once Nandigam, Venia Minks, MD        Allergies  Allergen Reactions  . Flagyl [Metronidazole] Hives  . Sulfa Antibiotics Hives    Objective: Physical Exam General: The patient is alert and oriented x3 in no acute distress.  Dermatology: Skin is warm, dry and supple  bilateral lower extremities. Nails 1-10 are normal. There is no erythema, edema, no eccymosis, no open lesions present. Integument is otherwise unremarkable.  Vascular: Dorsalis Pedis pulse and Posterior Tibial pulse are 2/4 bilateral. Capillary fill time is immediate to all digits.  Neurological: Grossly intact to light touch with an achilles reflex of +2/5 and a  negative Tinel's sign bilateral.  Musculoskeletal: Tenderness to palpation at the first metatarsophalangeal joint on the left foot especially at the plantar aspect and with range of motion, there is tenderness to palpation at the medial calcaneal tubercale and through the  insertion of the plantar fascia on the left greater than right foot. No pain with compression of calcaneus bilateral. No pain with tuning fork to calcaneus bilateral. No pain with calf compression bilateral. There is decreased Ankle joint range of motion bilateral.  There is excessive pull of the extensor hallucis longus to the first toe of the left foot.  All other joints range of motion within normal limits bilateral. Strength 5/5 in all groups bilateral.   Gait: Unassisted, Antalgic  Xray, Right/Left foot:  Normal osseous mineralization. Joint spaces preserved except midfoot where there is a small dorsal spur. No fracture/dislocation/boney destruction. Calcaneal spur present with mild thickening of plantar fascia. No other soft tissue abnormalities or radiopaque foreign bodies.   Assessment and Plan: Problem List Items Addressed This Visit    None    Visit Diagnoses    Bilateral foot pain    -  Primary   Relevant Medications   methylPREDNISolone (MEDROL DOSEPAK) 4 MG TBPK tablet   Other Relevant Orders   DG Foot Complete Right   DG Foot Complete Left   Rheumatoid arthritis of foot, unspecified laterality, unspecified rheumatoid factor presence (HCC)       Relevant Medications   celecoxib (CELEBREX) 200 MG capsule   methylPREDNISolone (MEDROL DOSEPAK) 4 MG TBPK tablet   Other Relevant Orders   Uric Acid   Sedimentation Rate   C-reactive protein   Rheumatoid factor   ANA, IFA Comprehensive Panel   HLA-B27 antigen   CBC with Differential   Plantar fasciitis, bilateral       Relevant Medications   methylPREDNISolone (MEDROL DOSEPAK) 4 MG TBPK tablet   Tendinitis       Relevant Medications   methylPREDNISolone (MEDROL DOSEPAK) 4 MG TBPK tablet   Capsulitis       Relevant Medications   methylPREDNISolone (MEDROL DOSEPAK) 4 MG TBPK tablet   Arthritis of foot       Relevant Medications   celecoxib (CELEBREX) 200 MG capsule   methylPREDNISolone (MEDROL DOSEPAK) 4 MG TBPK tablet       -Complete examination performed.  -Xrays reviewed -Discussed with patient in detail the condition of capsulitis, arthritis, tendinitis and plantar fasciitis, how this occurs and general treatment options. Explained both conservative and surgical treatments.  -Patient declines steroid injection at this time elected to oral steroids by mouth -Rx Medrol dose pack to take as directed and advised patient to take her Celebrex medication as prescribed by her primary care doctor consistently for the next 2 weeks to see if this will offer some additional relief -Recommended good supportive shoes  - Explained in detail the use of the fascial braces which was dispensed at today's visit and advised patient of these will benefit from custom insoles. -Explained and dispensed to patient daily stretching exercises. -Recommend patient to ice affected area 1-2x daily. -Ordered arthritic panel for further evaluation for possible other underlying inflammatory conditions since   patient has pain from multiple joints concerning for inflammatory joint pain; will call patient once results of blood work is made available -Patient to return to office in 3 weeks for follow up or sooner if problems or questions arise.  Titorya Stover, DPM  

## 2018-06-23 ENCOUNTER — Other Ambulatory Visit: Payer: Self-pay | Admitting: Sports Medicine

## 2018-06-23 DIAGNOSIS — M19079 Primary osteoarthritis, unspecified ankle and foot: Secondary | ICD-10-CM

## 2018-06-23 DIAGNOSIS — M779 Enthesopathy, unspecified: Secondary | ICD-10-CM

## 2018-06-23 DIAGNOSIS — M79671 Pain in right foot: Secondary | ICD-10-CM

## 2018-06-23 DIAGNOSIS — M722 Plantar fascial fibromatosis: Secondary | ICD-10-CM

## 2018-06-23 DIAGNOSIS — M79672 Pain in left foot: Principal | ICD-10-CM

## 2018-06-26 ENCOUNTER — Other Ambulatory Visit: Payer: Self-pay | Admitting: Sports Medicine

## 2018-06-26 DIAGNOSIS — M79671 Pain in right foot: Secondary | ICD-10-CM | POA: Diagnosis not present

## 2018-06-26 DIAGNOSIS — M069 Rheumatoid arthritis, unspecified: Secondary | ICD-10-CM | POA: Diagnosis not present

## 2018-06-26 DIAGNOSIS — Z7689 Persons encountering health services in other specified circumstances: Secondary | ICD-10-CM | POA: Diagnosis not present

## 2018-06-28 LAB — CBC WITH DIFFERENTIAL/PLATELET
BASOS: 0 %
Basophils Absolute: 0 10*3/uL (ref 0.0–0.2)
EOS (ABSOLUTE): 0 10*3/uL (ref 0.0–0.4)
EOS: 0 %
HEMATOCRIT: 39.8 % (ref 34.0–46.6)
Hemoglobin: 13.1 g/dL (ref 11.1–15.9)
IMMATURE GRANS (ABS): 0 10*3/uL (ref 0.0–0.1)
IMMATURE GRANULOCYTES: 0 %
Lymphocytes Absolute: 1.4 10*3/uL (ref 0.7–3.1)
Lymphs: 18 %
MCH: 29 pg (ref 26.6–33.0)
MCHC: 32.9 g/dL (ref 31.5–35.7)
MCV: 88 fL (ref 79–97)
MONOS ABS: 0.5 10*3/uL (ref 0.1–0.9)
Monocytes: 6 %
Neutrophils Absolute: 6.1 10*3/uL (ref 1.4–7.0)
Neutrophils: 76 %
PLATELETS: 370 10*3/uL (ref 150–450)
RBC: 4.51 x10E6/uL (ref 3.77–5.28)
RDW: 12.5 % (ref 12.3–15.4)
WBC: 8.1 10*3/uL (ref 3.4–10.8)

## 2018-06-28 LAB — SEDIMENTATION RATE: SED RATE: 9 mm/h (ref 0–32)

## 2018-06-28 LAB — URIC ACID: URIC ACID: 4.1 mg/dL (ref 2.5–7.1)

## 2018-06-28 LAB — C-REACTIVE PROTEIN: CRP: 2 mg/L (ref 0–10)

## 2018-06-28 LAB — RHEUMATOID FACTOR: Rhuematoid fact SerPl-aCnc: <10 IU/mL (ref 0.0–13.9)

## 2018-06-28 MED FILL — EMGALITY 120 MG/ML SOAJ: 120 | 30 days supply | Qty: 1 | Fill #5

## 2018-06-29 MED FILL — traZODone HCL 100 MG TABS: 100 | 90 days supply | Qty: 90 | Fill #0

## 2018-06-29 MED FILL — OMEPRAZOLE 40 MG CPDR: 40 | 90 days supply | Qty: 90 | Fill #0

## 2018-06-29 MED FILL — COLESEVELAM HCL 625 MG TABS: 625 | 30 days supply | Qty: 120 | Fill #0

## 2018-07-03 LAB — SPECIMEN STATUS REPORT

## 2018-07-03 LAB — HLA-B27 ANTIGEN: HLA B27: NEGATIVE

## 2018-07-06 MED FILL — lamoTRIgine 25 MG TABS: 25 | 90 days supply | Qty: 180 | Fill #0

## 2018-07-06 MED FILL — FLUoxetine HCL 20 MG CAPS: 20 | 90 days supply | Qty: 90 | Fill #0

## 2018-07-11 ENCOUNTER — Telehealth: Payer: Self-pay | Admitting: *Deleted

## 2018-07-11 NOTE — Telephone Encounter (Signed)
-----   Message from Asencion Islam, [place] sent at 07/11/2018  7:03 AM EST ----- Please let patient know that arthritic panel is Negative. No acute inflammatory arthritis detected on bloodwork. Patient to continue with instructions as given at last office visit. If pain continues or has worsen return to office within 2 weeks Thanks Dr. Marylene Land

## 2018-07-11 NOTE — Telephone Encounter (Signed)
-----   Message from Titorya Stover, DPM sent at 07/11/2018  7:03 AM EST ----- Please let patient know that arthritic panel is Negative. No acute inflammatory arthritis detected on bloodwork. Patient to continue with instructions as given at last office visit. If pain continues or has worsen return to office within 2 weeks Thanks Dr. Stover 

## 2018-07-11 NOTE — Telephone Encounter (Signed)
Left message informing pt of Dr. Wynema Birch 07/11/2018 10:19am review of results and orders.

## 2018-07-11 NOTE — Telephone Encounter (Signed)
Pt called for results. I informed pt of Dr. Wynema Birch review of results and orders. Pt asked if she should continue with the braces. I told pt yes. Pt asked if she should look in to orthotics. I told pt that if she was looking at the custom made orthotics in our office it would be best to make an appt to come in and discuss with Dr. Marylene Land in 2 weeks. I told pt I wear the orthotics and can say they have helped me 100%. Transferred pt to schedulers.

## 2018-08-01 MED FILL — EMGALITY 120 MG/ML SOAJ: 120 | 30 days supply | Qty: 1 | Fill #6

## 2018-08-01 MED FILL — ALPRAZolam 1 MG TABS: 1 | 30 days supply | Qty: 45 | Fill #0

## 2018-08-01 MED FILL — FROVATRIPTAN SUCC 2.5 MG TA: 2.5 | 30 days supply | Qty: 9 | Fill #2

## 2018-08-23 ENCOUNTER — Ambulatory Visit: Payer: 59 | Admitting: Sports Medicine

## 2018-08-23 ENCOUNTER — Encounter: Payer: Self-pay | Admitting: Sports Medicine

## 2018-08-23 DIAGNOSIS — M19079 Primary osteoarthritis, unspecified ankle and foot: Secondary | ICD-10-CM

## 2018-08-23 DIAGNOSIS — M779 Enthesopathy, unspecified: Secondary | ICD-10-CM

## 2018-08-23 DIAGNOSIS — M79672 Pain in left foot: Secondary | ICD-10-CM | POA: Diagnosis not present

## 2018-08-23 DIAGNOSIS — M722 Plantar fascial fibromatosis: Secondary | ICD-10-CM

## 2018-08-23 DIAGNOSIS — M79671 Pain in right foot: Secondary | ICD-10-CM | POA: Diagnosis not present

## 2018-08-23 MED ORDER — TRIAMCINOLONE ACETONIDE 10 MG/ML IJ SUSP
10.0000 mg | Freq: Once | INTRAMUSCULAR | Status: AC
  Administered 2018-08-23: 10 mg

## 2018-08-23 NOTE — Progress Notes (Signed)
Subjective: Sherry Dunn is a 48 y.o. female patient returns office for follow-up of bilateral foot pain.  Patient reports the pain still comes and goes.  Patient reports that she still has a lot of pain around her bone spurs arch involvement of both feet states that the pain is sharp 7 out of 10 worse with pressure or constant use states that her fascial brace helps some and that she has taken her Celebrex for 2 weeks without any additional relief but has continued to stretch states that her feet hurt and nothing has helped to make the pain completely better.  Patient denies redness warmth swelling or any other constitutional symptoms at this time.    Patient Active Problem List   Diagnosis Date Noted  . Hyperacusis 12/25/2015  . 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 Postoperative state 03/24/2015    Current Outpatient Medications on File Prior to Visit  Medication Sig Dispense Refill  . ALPRAZolam (XANAX) 1 MG tablet   3  . celecoxib (CELEBREX) 200 MG capsule Take 200 mg by mouth 2 (two) times daily.    . cetirizine (ZYRTEC) 10 MG tablet Take 10 mg by mouth daily.    . colesevelam (WELCHOL) 625 MG tablet Take 625 mg by mouth daily.    Marland Kitchen estradiol (ESTRACE VAGINAL) 0.1 MG/GM vaginal cream Place 0.25 Applicatorfuls vaginally 2 (two) times a week. 42.5 g 4  . estradiol (VIVELLE-DOT) 0.05 MG/24HR patch Place 1 patch (0.05 mg total) onto the skin 2 (two) times a week. 24 patch 4  . FLUoxetine (PROZAC) 20 MG capsule Take 20 mg by mouth daily.    Marland Kitchen Galcanezumab-gnlm (EMGALITY) 120 MG/ML SOAJ Inject into the skin.    . LamoTRIgine 50 MG TBDP Take by mouth at bedtime as needed.    . methylPREDNISolone (MEDROL DOSEPAK) 4 MG TBPK tablet Take as directed 21 tablet 0  . omeprazole (PRILOSEC) 40 MG capsule TAKE 1 CAPSULE BY MOUTH ONCE DAILY BEFORE BREAKFAST 30 capsule 0  . traZODone (DESYREL) 50 MG tablet Take 50 mg by mouth at bedtime.     Current Facility-Administered Medications on File Prior to Visit   Medication Dose Route Frequency Provider Last Rate Last Dose  . 0.9 %  sodium chloride infusion  500 mL Intravenous Once Nandigam, Eleonore Chiquito, MD        Allergies  Allergen Reactions  . Flagyl [Metronidazole] Hives  . Sulfa Antibiotics Hives    Objective: Physical Exam General: The patient is alert and oriented x3 in no acute distress.  Dermatology: Skin is warm, dry and supple bilateral lower extremities. Nails 1-10 are normal. There is no erythema, edema, no eccymosis, no open lesions present. Integument is otherwise unremarkable.  Vascular: Dorsalis Pedis pulse and Posterior Tibial pulse are 2/4 bilateral. Capillary fill time is immediate to all digits.  Neurological: Grossly intact to light touch with an achilles reflex of +2/5 and a  negative Tinel's sign bilateral.  Musculoskeletal: Tenderness to palpation at the first metatarsophalangeal joint on the left foot especially at the plantar aspect and with range of motion, there is tenderness to palpation at the medial calcaneal tubercale and through the insertion of the plantar fascia on the left greater than right foot that extends into the arch left greater than right. No pain with compression of calcaneus bilateral. No pain with tuning fork to calcaneus bilateral. No pain with calf compression bilateral. There is decreased Ankle joint range of motion bilateral.  There is excessive pull of the extensor  hallucis longus to the first toe of the left foot.  Midfoot bone spur dorsally with mild pain to palpation left greater than right.  All other joints range of motion within normal limits bilateral except midtarsal joint. Strength 5/5 in all groups bilateral.   Assessment and Plan: Problem List Items Addressed This Visit    None    Visit Diagnoses    Plantar fasciitis, bilateral    -  Primary   Tendinitis       Capsulitis       Bilateral foot pain       Arthritis of foot       Relevant Medications   triamcinolone acetonide  (KENALOG) 10 MG/ML injection 10 mg (Completed) (Start on 08/23/2018 10:30 PM)      -Complete examination performed. -Discussed with patient in detail the condition of capsulitis, arthritis, tendinitis and plantar fasciitis, how this occurs and general treatment options. Explained both conservative and surgical treatments.  -Patient declined cam boot at this time -After oral consent and aseptic prep, injected a mixture containing 1 ml of 2%  plain lidocaine, 1 ml 0.5% plain marcaine, 0.5 ml of kenalog 10 and 0.5 ml of dexamethasone phosphate into left dorsal midfoot bone spur without complication. Post-injection care discussed with patient.  -Applied plantar fascial taping to left foot and advised patient if this seems to help with pain that she will benefit from orthotics however if pain is still present to call office for cam boot -Continue with rest ice elevation gentle stretching and Celebrex as needed -Patient to return to office in 3 weeks for follow up or sooner if problems or questions arise.  Asencion Islam, DPM

## 2018-08-23 NOTE — Patient Instructions (Signed)

## 2018-08-24 ENCOUNTER — Telehealth: Payer: Self-pay | Admitting: Sports Medicine

## 2018-08-24 NOTE — Telephone Encounter (Signed)
I saw Dr. Marylene Land yesterday and I'm still having problems with my foot. Can I please get a call back today at [phone].

## 2018-08-28 ENCOUNTER — Encounter: Payer: Self-pay | Admitting: *Deleted

## 2018-09-11 MED FILL — EMGALITY 120 MG/ML SOAJ: 120 | 30 days supply | Qty: 1 | Fill #0

## 2018-09-11 MED FILL — DOTTI 0.05 MG/24HR PTTW: 0.05 | 84 days supply | Qty: 24 | Fill #1

## 2018-09-13 ENCOUNTER — Ambulatory Visit: Payer: 59 | Admitting: Sports Medicine

## 2018-09-13 ENCOUNTER — Encounter: Payer: Self-pay | Admitting: *Deleted

## 2018-09-13 ENCOUNTER — Encounter: Payer: Self-pay | Admitting: Sports Medicine

## 2018-09-13 DIAGNOSIS — M779 Enthesopathy, unspecified: Secondary | ICD-10-CM

## 2018-09-13 DIAGNOSIS — M629 Disorder of muscle, unspecified: Secondary | ICD-10-CM | POA: Diagnosis not present

## 2018-09-13 DIAGNOSIS — M722 Plantar fascial fibromatosis: Secondary | ICD-10-CM | POA: Diagnosis not present

## 2018-09-13 DIAGNOSIS — M79671 Pain in right foot: Secondary | ICD-10-CM

## 2018-09-13 DIAGNOSIS — M19079 Primary osteoarthritis, unspecified ankle and foot: Secondary | ICD-10-CM | POA: Diagnosis not present

## 2018-09-13 DIAGNOSIS — M899 Disorder of bone, unspecified: Secondary | ICD-10-CM

## 2018-09-13 DIAGNOSIS — M79672 Pain in left foot: Secondary | ICD-10-CM

## 2018-09-13 NOTE — Progress Notes (Signed)
Subjective: Sherry Dunn is a 48 y.o. female patient returns office for follow-up of bilateral foot pain.  Patient had injection last visit at the top of her left foot and reports the pain still there feels like she is getting more pain across her arches and now worse pain at her big toe joint on the left; pain is sharp and worse with pressure 5 out of 10. Patient has been consistent with stretching, icing, and wearing braces with no relief. Patient denies any acute episodes of trauma increased swelling bruising or any other causative factors at this time.   Patient Active Problem List   Diagnosis Date Noted  . Hyperacusis 12/25/2015  . 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 Postoperative state 03/24/2015    Current Outpatient Medications on File Prior to Visit  Medication Sig Dispense Refill  . ALPRAZolam (XANAX) 1 MG tablet   3  . celecoxib (CELEBREX) 200 MG capsule Take 200 mg by mouth 2 (two) times daily.    . cetirizine (ZYRTEC) 10 MG tablet Take 10 mg by mouth daily.    . colesevelam (WELCHOL) 625 MG tablet Take 625 mg by mouth daily.    Marland Kitchen estradiol (ESTRACE VAGINAL) 0.1 MG/GM vaginal cream Place 0.25 Applicatorfuls vaginally 2 (two) times a week. 42.5 g 4  . estradiol (VIVELLE-DOT) 0.05 MG/24HR patch Place 1 patch (0.05 mg total) onto the skin 2 (two) times a week. 24 patch 4  . FLUoxetine (PROZAC) 20 MG capsule Take 20 mg by mouth daily.    Marland Kitchen Galcanezumab-gnlm (EMGALITY) 120 MG/ML SOAJ Inject into the skin.    . LamoTRIgine 50 MG TBDP Take by mouth at bedtime as needed.    . methylPREDNISolone (MEDROL DOSEPAK) 4 MG TBPK tablet Take as directed 21 tablet 0  . omeprazole (PRILOSEC) 40 MG capsule TAKE 1 CAPSULE BY MOUTH ONCE DAILY BEFORE BREAKFAST 30 capsule 0  . traZODone (DESYREL) 50 MG tablet Take 50 mg by mouth at bedtime.     Current Facility-Administered Medications on File Prior to Visit  Medication Dose Route Frequency Provider Last Rate Last Dose  . 0.9 %  sodium chloride  infusion  500 mL Intravenous Once Nandigam, Eleonore Chiquito, MD        Allergies  Allergen Reactions  . Flagyl [Metronidazole] Hives  . Sulfa Antibiotics Hives    Objective: Physical Exam General: The patient is alert and oriented x3 in no acute distress.  Dermatology: Skin is warm, dry and supple bilateral lower extremities. Nails 1-10 are normal. There is no erythema, edema, no eccymosis, no open lesions present. Integument is otherwise unremarkable.  Vascular: Dorsalis Pedis pulse and Posterior Tibial pulse are 2/4 bilateral. Capillary fill time is immediate to all digits.  Neurological: Grossly intact to light touch with an achilles reflex of +2/5 and a  negative Tinel's sign bilateral.  Musculoskeletal: Tenderness to palpation at the first metatarsophalangeal joint on the left foot especially at the plantar aspect and with range of motion, there is tenderness to palpation at the medial calcaneal tubercale and through the insertion of the plantar fascia on the left greater than right foot that extends into the arch left greater than right with subjective spasms and cramping. No pain with compression of calcaneus bilateral. No pain with tuning fork to calcaneus bilateral. No pain with calf compression bilateral. There is decreased Ankle joint range of motion bilateral.  There is excessive pull of the extensor hallucis longus to the first toe of the left foot.  Midfoot bone  spur dorsally with mild pain to palpation left greater than right.  All other joints range of motion within normal limits bilateral except midtarsal joint. Strength 5/5 in all groups bilateral.   Assessment and Plan: Problem List Items Addressed This Visit    None    Visit Diagnoses    Nontraumatic tear of plantar fascia    -  Primary   Plantar fasciitis, bilateral       Tendinitis       Capsulitis       Bilateral foot pain       Arthritis of foot       Sesamoid pain          -Complete examination  performed. -Re-Discussed with patient in detail the condition of capsulitis, arthritis, tendinitis and plantar fasciitis that has not improved with possible nontraumatic tear of plantar fascia and possible sesamoid fracture on left, how this occurs and general treatment options. Explained both conservative and surgical treatments.  -Ordered MRIs for further evaluation since patient continues to fail conservative treatments and it has been beyond a treatment.  Of 8 weeks with no improvement  -Dispensed cam boot for patient to use on her left foot until after MRI -Continue with rest ice elevation gentle stretching -Continue with plantar fascial brace on the right; replacement brace given at this visit since her current brace Velcro was not sticking -Patient to return to office after MRIs or sooner if problems or questions arise.  Asencion Islam, DPM

## 2018-09-14 ENCOUNTER — Telehealth: Payer: Self-pay | Admitting: *Deleted

## 2018-09-14 DIAGNOSIS — M779 Enthesopathy, unspecified: Secondary | ICD-10-CM

## 2018-09-14 DIAGNOSIS — M899 Disorder of bone, unspecified: Secondary | ICD-10-CM

## 2018-09-14 DIAGNOSIS — M629 Disorder of muscle, unspecified: Secondary | ICD-10-CM

## 2018-09-14 DIAGNOSIS — M722 Plantar fascial fibromatosis: Secondary | ICD-10-CM

## 2018-09-14 NOTE — Telephone Encounter (Signed)
Orders to J. Quintana, RN for pre-cert, faxed to [facility]. 

## 2018-09-14 NOTE — Telephone Encounter (Signed)
-----   Message from [place], [place] sent at 09/13/2018  3:41 PM EST ----- Regarding: MRI bilateral Left foot r/o tear of plantar fascia and stress to sesamoids Right foot evaluate plantar fascia

## 2018-09-14 NOTE — Telephone Encounter (Signed)
I called pt, she [place] in [place] and would like to be [place] in [place] at [place].

## 2018-09-22 ENCOUNTER — Telehealth: Payer: Self-pay | Admitting: *Deleted

## 2018-09-22 NOTE — Telephone Encounter (Signed)
Patient called wanted to know if there is anywhere else she can go that would not be so expensive they have told her it will be 1300 up front,  She had hoped if she went to a [facility] facility that it would be more reasonable as a [facility] insured.  Can you please advise patient.

## 2018-09-25 DIAGNOSIS — N3090 Cystitis, unspecified without hematuria: Secondary | ICD-10-CM | POA: Diagnosis not present

## 2018-09-25 DIAGNOSIS — N3001 Acute cystitis with hematuria: Secondary | ICD-10-CM | POA: Diagnosis not present

## 2018-09-25 MED FILL — OMEPRAZOLE 40 MG CPDR: 40 | 90 days supply | Qty: 90 | Fill #1

## 2018-09-25 MED FILL — ALPRAZolam 1 MG TABS: 1 | 30 days supply | Qty: 45 | Fill #1

## 2018-09-26 ENCOUNTER — Ambulatory Visit (HOSPITAL_COMMUNITY): Payer: 59

## 2018-09-27 ENCOUNTER — Encounter: Payer: Self-pay | Admitting: Sports Medicine

## 2018-10-02 MED FILL — traZODone HCL 100 MG TABS: 100 | 90 days supply | Qty: 90 | Fill #1

## 2018-10-02 MED FILL — lamoTRIgine 25 MG TABS: 25 | 90 days supply | Qty: 180 | Fill #1

## 2018-10-02 MED FILL — FLUoxetine HCL 20 MG CAPS: 20 | 90 days supply | Qty: 90 | Fill #1

## 2018-10-02 MED FILL — CELECOXIB 200 MG CAP: 200 | 90 days supply | Qty: 90 | Fill #1

## 2018-10-04 ENCOUNTER — Ambulatory Visit (HOSPITAL_COMMUNITY)
Admission: RE | Admit: 2018-10-04 | Discharge: 2018-10-04 | Disposition: A | Discharge status: Home / Self Care | Payer: 59 | Source: Ambulatory Visit | Attending: Sports Medicine | Admitting: Sports Medicine

## 2018-10-04 DIAGNOSIS — M629 Disorder of muscle, unspecified: Secondary | ICD-10-CM | POA: Insufficient documentation

## 2018-10-04 DIAGNOSIS — M722 Plantar fascial fibromatosis: Secondary | ICD-10-CM | POA: Insufficient documentation

## 2018-10-04 DIAGNOSIS — M899 Disorder of bone, unspecified: Secondary | ICD-10-CM

## 2018-10-04 DIAGNOSIS — M779 Enthesopathy, unspecified: Secondary | ICD-10-CM | POA: Diagnosis not present

## 2018-10-04 DIAGNOSIS — M79671 Pain in right foot: Secondary | ICD-10-CM | POA: Diagnosis not present

## 2018-10-04 DIAGNOSIS — M79672 Pain in left foot: Secondary | ICD-10-CM | POA: Diagnosis not present

## 2018-10-04 MED ORDER — GADOBUTROL 1 MMOL/ML IV SOLN
6.0000 mL | Freq: Once | INTRAVENOUS | Status: AC | PRN
  Administered 2018-10-04: 6 mL via INTRAVENOUS

## 2018-10-05 MED FILL — predniSONE 10 MG TABS: 10 | 5 days supply | Qty: 20 | Fill #0

## 2018-10-10 ENCOUNTER — Encounter: Payer: Self-pay | Admitting: Sports Medicine

## 2018-10-18 ENCOUNTER — Encounter: Payer: Self-pay | Admitting: Sports Medicine

## 2018-10-18 ENCOUNTER — Ambulatory Visit: Payer: 59 | Admitting: Sports Medicine

## 2018-10-18 DIAGNOSIS — R252 Cramp and spasm: Secondary | ICD-10-CM

## 2018-10-18 DIAGNOSIS — M7752 Other enthesopathy of left foot: Secondary | ICD-10-CM | POA: Diagnosis not present

## 2018-10-18 DIAGNOSIS — M79671 Pain in right foot: Secondary | ICD-10-CM

## 2018-10-18 DIAGNOSIS — M779 Enthesopathy, unspecified: Secondary | ICD-10-CM

## 2018-10-18 DIAGNOSIS — M79672 Pain in left foot: Secondary | ICD-10-CM

## 2018-10-18 DIAGNOSIS — M722 Plantar fascial fibromatosis: Secondary | ICD-10-CM

## 2018-10-18 MED ORDER — TRIAMCINOLONE ACETONIDE 10 MG/ML IJ SUSP
10.0000 mg | Freq: Once | INTRAMUSCULAR | Status: AC
  Administered 2018-10-18: 10 mg

## 2018-10-18 NOTE — Progress Notes (Signed)
Subjective: Sherry Dunn is a 48 y.o. female patient returns office for follow-up of bilateral foot pain.  Patient reports that her feet are feeling a little bit better pain is now 8 out of 10 hurts most on of the left ball states that her cam boot helps she tried to go without it but had increased episode of pain has been taking Celebrex and Tylenol as needed and has been stretching and icing and wearing fascial brace on the right.  Patient is also here for review of MRI results.  No other pedal complaints noted.   Patient Active Problem List   Diagnosis Date Noted  . Hyperacusis 12/25/2015  . 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 Postoperative state 03/24/2015    Current Outpatient Medications on File Prior to Visit  Medication Sig Dispense Refill  . ALPRAZolam (XANAX) 1 MG tablet   3  . celecoxib (CELEBREX) 200 MG capsule Take 200 mg by mouth 2 (two) times daily.    . cetirizine (ZYRTEC) 10 MG tablet Take 10 mg by mouth daily.    . colesevelam (WELCHOL) 625 MG tablet Take 625 mg by mouth daily.    Marland Kitchen estradiol (ESTRACE VAGINAL) 0.1 MG/GM vaginal cream Place 0.25 Applicatorfuls vaginally 2 (two) times a week. 42.5 g 4  . estradiol (VIVELLE-DOT) 0.05 MG/24HR patch Place 1 patch (0.05 mg total) onto the skin 2 (two) times a week. 24 patch 4  . FLUoxetine (PROZAC) 20 MG capsule Take 20 mg by mouth daily.    Marland Kitchen Galcanezumab-gnlm (EMGALITY) 120 MG/ML SOAJ Inject into the skin.    . LamoTRIgine 50 MG TBDP Take by mouth at bedtime as needed.    . methylPREDNISolone (MEDROL DOSEPAK) 4 MG TBPK tablet Take as directed 21 tablet 0  . omeprazole (PRILOSEC) 40 MG capsule TAKE 1 CAPSULE BY MOUTH ONCE DAILY BEFORE BREAKFAST 30 capsule 0  . traZODone (DESYREL) 50 MG tablet Take 50 mg by mouth at bedtime.     Current Facility-Administered Medications on File Prior to Visit  Medication Dose Route Frequency Provider Last Rate Last Dose  . 0.9 %  sodium chloride infusion  500 mL Intravenous Once Nandigam,  Eleonore Chiquito, MD        Allergies  Allergen Reactions  . Flagyl [Metronidazole] Hives  . Sulfa Antibiotics Hives    Objective: Physical Exam General: The patient is alert and oriented x3 in no acute distress.  Dermatology: Skin is warm, dry and supple bilateral lower extremities. Nails 1-10 are normal. There is no erythema, edema, no eccymosis, no open lesions present. Integument is otherwise unremarkable.  Vascular: Dorsalis Pedis pulse and Posterior Tibial pulse are 2/4 bilateral. Capillary fill time is immediate to all digits.  Neurological: Grossly intact to light touch with an achilles reflex of +2/5 and a  negative Tinel's sign bilateral.  Musculoskeletal: Tenderness to palpation at the first metatarsophalangeal joint on the left foot especially at the plantar aspect and with range of motion with evidence of bursitis on MRI, there is decreased pain to palpation at the medial calcaneal tubercale and through the insertion of the plantar fascia on the left greater than right foot that extends into the arch left greater than right with subjective spasms and cramping that is slowly getting better. No pain with compression of calcaneus bilateral. No pain with tuning fork to calcaneus bilateral. No pain with calf compression bilateral. There is decreased Ankle joint range of motion bilateral.  There is excessive pull of the extensor hallucis longus to  the first toe of the left foot.  Midfoot bone spur dorsally with mild pain to palpation left greater than right.  All other joints range of motion within normal limits bilateral except midtarsal joint. Strength 5/5 in all groups bilateral.   MRI consistent with bursitis left first metatarsophalangeal joint  Assessment and Plan: Problem List Items Addressed This Visit    None    Visit Diagnoses    Capsulitis    -  Primary   Relevant Medications   triamcinolone acetonide (KENALOG) 10 MG/ML injection 10 mg (Completed) (Start on 10/18/2018  2:30  PM)   Bursitis of left foot       Relevant Medications   triamcinolone acetonide (KENALOG) 10 MG/ML injection 10 mg (Completed) (Start on 10/18/2018  2:30 PM)   Plantar fasciitis, bilateral       Bilateral foot pain       Cramping of feet          -Complete examination performed. -Re-Discussed with patient in detail the condition of capsulitis, arthritis, tendinitis and plantar fasciitis and cramping -MRI results were reviewed -After oral consent and aseptic prep, injected a mixture containing 1 ml of 2%  plain lidocaine, 1 ml 0.5% plain marcaine, 0.5 ml of kenalog 10 and 0.5 ml of dexamethasone phosphate into plantar first metatarsophalangeal joint on left without complication. Post-injection care discussed with patient.  -Advised patient to ice tonight and to continue with cam boot for 1 more week and may slowly transition after that to get good supportive tennis shoe with metatarsal padding or over-the-counter insole -Continue with rest ice elevation gentle stretching as tolerated -Continue with plantar fascial brace on the right -Continue with Celebrex and Tylenol as needed -Patient to return to office 2 to 3 weeks for injection site follow-up and foot pain follow-up or sooner if problems or questions arise.  Asencion Islam, DPM

## 2018-11-01 MED FILL — EMGALITY 120 MG/ML SOAJ: 120 | 30 days supply | Qty: 1 | Fill #1

## 2018-11-03 MED FILL — ALPRAZolam 1 MG TABS: 1 | 30 days supply | Qty: 45 | Fill #2

## 2018-11-08 ENCOUNTER — Ambulatory Visit: Payer: 59 | Admitting: Sports Medicine

## 2018-12-11 MED FILL — OMEPRAZOLE 40 MG CPDR: 40 | 90 days supply | Qty: 90 | Fill #2

## 2018-12-11 MED FILL — DOTTI 0.05 MG/24HR PTTW: 0.05 | 84 days supply | Qty: 24 | Fill #2

## 2018-12-11 MED FILL — EMGALITY 120 MG/ML SOAJ: 120 | 30 days supply | Qty: 1 | Fill #2

## 2018-12-11 MED FILL — FROVATRIPTAN SUCC 2.5 MG TA: 2.5 | 30 days supply | Qty: 9 | Fill #3

## 2018-12-11 MED FILL — FLUoxetine HCL 20 MG CAPS: 20 | 90 days supply | Qty: 90 | Fill #2

## 2018-12-11 MED FILL — lamoTRIgine 25 MG TABS: 25 | 90 days supply | Qty: 180 | Fill #2

## 2018-12-11 MED FILL — traZODone HCL 100 MG TABS: 100 | 90 days supply | Qty: 90 | Fill #2

## 2018-12-13 ENCOUNTER — Encounter: Payer: Self-pay | Admitting: Sports Medicine

## 2018-12-13 ENCOUNTER — Other Ambulatory Visit: Payer: Self-pay

## 2018-12-13 ENCOUNTER — Ambulatory Visit: Payer: 59 | Admitting: Sports Medicine

## 2018-12-13 VITALS — Temp 97.7°F | Resp 16

## 2018-12-13 DIAGNOSIS — M722 Plantar fascial fibromatosis: Secondary | ICD-10-CM

## 2018-12-13 DIAGNOSIS — M79671 Pain in right foot: Secondary | ICD-10-CM | POA: Diagnosis not present

## 2018-12-13 DIAGNOSIS — M7752 Other enthesopathy of left foot: Secondary | ICD-10-CM | POA: Diagnosis not present

## 2018-12-13 DIAGNOSIS — R252 Cramp and spasm: Secondary | ICD-10-CM

## 2018-12-13 DIAGNOSIS — M779 Enthesopathy, unspecified: Secondary | ICD-10-CM

## 2018-12-13 DIAGNOSIS — M79672 Pain in left foot: Secondary | ICD-10-CM | POA: Diagnosis not present

## 2018-12-13 NOTE — Progress Notes (Signed)
Subjective: Sherry Dunn is a 48 y.o. female patient returns office for follow-up of bilateral foot pain.  Patient reports that after the injection her left foot feels a lot better but still does have some pain at the bottom of the joint with swelling that extends from the bottom also to the top of the joint in between the first and second toes and some pain that radiates up the top of her foot to her ankles left greater than right pain now off and on 5 out of 10 states that she did get new shoes and they seem to be helping a lot and has been stretching icing taking Tylenol occasionally and Celebrex occasionally.  Patient denies any constitutional symptoms.  No other pedal complaints noted.   Patient Active Problem List   Diagnosis Date Noted  . Hyperacusis 12/25/2015  . 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 Postoperative state 03/24/2015    Current Outpatient Medications on File Prior to Visit  Medication Sig Dispense Refill  . ALPRAZolam (XANAX) 1 MG tablet   3  . celecoxib (CELEBREX) 200 MG capsule Take 200 mg by mouth 2 (two) times daily.    . cetirizine (ZYRTEC) 10 MG tablet Take 10 mg by mouth daily.    . colesevelam (WELCHOL) 625 MG tablet Take 625 mg by mouth daily.    Marland Kitchen estradiol (ESTRACE VAGINAL) 0.1 MG/GM vaginal cream Place 0.25 Applicatorfuls vaginally 2 (two) times a week. 42.5 g 4  . estradiol (VIVELLE-DOT) 0.05 MG/24HR patch Place 1 patch (0.05 mg total) onto the skin 2 (two) times a week. 24 patch 4  . FLUoxetine (PROZAC) 20 MG capsule Take 20 mg by mouth daily.    Marland Kitchen Galcanezumab-gnlm (EMGALITY) 120 MG/ML SOAJ Inject into the skin.    . LamoTRIgine 50 MG TBDP Take by mouth at bedtime as needed.    . methylPREDNISolone (MEDROL DOSEPAK) 4 MG TBPK tablet Take as directed 21 tablet 0  . omeprazole (PRILOSEC) 40 MG capsule TAKE 1 CAPSULE BY MOUTH ONCE DAILY BEFORE BREAKFAST 30 capsule 0  . traZODone (DESYREL) 50 MG tablet Take 50 mg by mouth at bedtime.     Current  Facility-Administered Medications on File Prior to Visit  Medication Dose Route Frequency Provider Last Rate Last Dose  . 0.9 %  sodium chloride infusion  500 mL Intravenous Once Nandigam, Eleonore Chiquito, MD        Allergies  Allergen Reactions  . Flagyl [Metronidazole] Hives  . Sulfa Antibiotics Hives    Objective: Physical Exam General: The patient is alert and oriented x3 in no acute distress.  Dermatology: Skin is warm, dry and supple bilateral lower extremities. Nails 1-10 are normal. There is no erythema, edema, no eccymosis, no open lesions present. Integument is otherwise unremarkable.  Vascular: Dorsalis Pedis pulse and Posterior Tibial pulse are 2/4 bilateral. Capillary fill time is immediate to all digits.  Neurological: Grossly intact to light touch with an achilles reflex of +2/5 and a  negative Tinel's sign bilateral.  Musculoskeletal: There is mild tenderness to palpation at the first metatarsophalangeal joint on the left foot especially at the plantar aspect greater than the dorsal lateral aspect, there is decreased pain to palpation at the medial calcaneal tubercale and through the insertion of the plantar fascia on the left greater than right foot that extends into the arch left greater than right with subjective spasms and cramping that is slowly getting better like before with pain that radiates to ankles. No pain with compression  of calcaneus bilateral. No pain with tuning fork to calcaneus bilateral. No pain with calf compression bilateral. There is decreased Ankle joint range of motion bilateral.  There is excessive pull of the extensor hallucis longus to the first toe of the left foot.  Midfoot bone spur dorsally with mild pain to palpation left greater than right.  All other joints range of motion within normal limits bilateral except midtarsal joint. Strength 5/5 in all groups bilateral.    Assessment and Plan: Problem List Items Addressed This Visit    None    Visit  Diagnoses    Capsulitis    -  Primary   Bursitis of left foot       Plantar fasciitis, bilateral       Bilateral foot pain       Cramping of feet          -Complete examination performed. -Re-Discussed with patient in detail the condition of capsulitis/bursitis, arthritis, tendinitis and plantar fasciitis and cramping that is slowly improving -At this time we will hold off on a another injection but did advise patient to closely monitor symptoms and if pain seems like it is worsening may benefit from a repeat steroid shot -Patient declined physical therapy at this time -Prescribed topical [facility] cream [facility] -Continue with rest ice elevation gentle stretching as tolerated -Continue with good supportive shoes daily with offloading padding as I provided today in her insoles -Continue with Celebrex and Tylenol and recommended patient to continue with his medication regimen for at least 2 weeks to help decrease inflammation -Patient to return to office as needed or sooner if problems or questions arise.  Asencion Islam, DPM

## 2018-12-21 DIAGNOSIS — G43909 Migraine, unspecified, not intractable, without status migrainosus: Secondary | ICD-10-CM | POA: Diagnosis not present

## 2018-12-21 DIAGNOSIS — K219 Gastro-esophageal reflux disease without esophagitis: Secondary | ICD-10-CM | POA: Diagnosis not present

## 2018-12-21 DIAGNOSIS — M79672 Pain in left foot: Secondary | ICD-10-CM | POA: Diagnosis not present

## 2018-12-21 DIAGNOSIS — Z1331 Encounter for screening for depression: Secondary | ICD-10-CM | POA: Diagnosis not present

## 2018-12-21 DIAGNOSIS — F4323 Adjustment disorder with mixed anxiety and depressed mood: Secondary | ICD-10-CM | POA: Diagnosis not present

## 2018-12-21 DIAGNOSIS — Z6825 Body mass index (BMI) 25.0-25.9, adult: Secondary | ICD-10-CM | POA: Diagnosis not present

## 2018-12-22 MED FILL — CELECOXIB 200 MG CAPSULE: 200 | 90 days supply | Qty: 90 | Fill #0

## 2018-12-22 MED FILL — ALPRAZolam 1 MG TABS: 1 | 30 days supply | Qty: 45 | Fill #0

## 2019-01-19 DIAGNOSIS — Z6825 Body mass index (BMI) 25.0-25.9, adult: Secondary | ICD-10-CM | POA: Diagnosis not present

## 2019-01-19 DIAGNOSIS — R51 Headache: Secondary | ICD-10-CM | POA: Diagnosis not present

## 2019-01-19 DIAGNOSIS — R030 Elevated blood-pressure reading, without diagnosis of hypertension: Secondary | ICD-10-CM | POA: Diagnosis not present

## 2019-01-26 ENCOUNTER — Other Ambulatory Visit: Payer: Self-pay | Admitting: Family Medicine

## 2019-01-26 DIAGNOSIS — R519 Headache, unspecified: Secondary | ICD-10-CM

## 2019-01-28 MED FILL — ALPRAZolam 1 MG TABS: 1 | 30 days supply | Qty: 45 | Fill #1

## 2019-01-29 MED FILL — EMGALITY 120 MG/ML SOAJ: 120 | 30 days supply | Qty: 1 | Fill #3

## 2019-02-13 ENCOUNTER — Ambulatory Visit
Admission: RE | Admit: 2019-02-13 | Discharge: 2019-02-13 | Disposition: A | Discharge status: Home / Self Care | Payer: 59 | Source: Ambulatory Visit | Attending: Family Medicine | Admitting: Family Medicine

## 2019-02-13 ENCOUNTER — Other Ambulatory Visit: Payer: Self-pay

## 2019-02-13 DIAGNOSIS — R51 Headache: Secondary | ICD-10-CM | POA: Diagnosis not present

## 2019-02-13 DIAGNOSIS — R519 Headache, unspecified: Secondary | ICD-10-CM

## 2019-03-14 MED FILL — traZODone HCL 100 MG TABS: 100 | 90 days supply | Qty: 90 | Fill #3

## 2019-03-14 MED FILL — ALPRAZolam 1 MG TABS: 1 | 30 days supply | Qty: 45 | Fill #2

## 2019-03-28 MED FILL — EMGALITY 120 MG/ML SOAJ: 120 | 30 days supply | Qty: 1 | Fill #4

## 2019-03-28 MED FILL — lamoTRIgine 25 MG TABS: 25 | 90 days supply | Qty: 180 | Fill #0

## 2019-03-28 MED FILL — FLUoxetine HCL 20 MG CAPS: 20 | 90 days supply | Qty: 90 | Fill #3

## 2019-04-05 MED FILL — lamoTRIgine 25 MG TABS: 25 | 90 days supply | Qty: 180 | Fill #0

## 2019-04-24 MED FILL — ALPRAZolam 1 MG TABS: 1 | 30 days supply | Qty: 45 | Fill #3

## 2019-04-24 MED FILL — EMGALITY 120 MG/ML SOAJ: 120 | 30 days supply | Qty: 1 | Fill #5

## 2019-05-08 MED FILL — EMGALITY 120 MG/ML SOAJ: 120 | 30 days supply | Qty: 1 | Fill #5

## 2019-05-08 MED FILL — OMEPRAZOLE 40 MG CPDR: 40 | 90 days supply | Qty: 90 | Fill #3

## 2019-05-17 DIAGNOSIS — J309 Allergic rhinitis, unspecified: Secondary | ICD-10-CM | POA: Diagnosis not present

## 2019-05-17 DIAGNOSIS — J0100 Acute maxillary sinusitis, unspecified: Secondary | ICD-10-CM | POA: Diagnosis not present

## 2019-05-23 MED FILL — METOPROLOL SUCCINATE ER 25: 25 | 30 days supply | Qty: 30 | Fill #0

## 2019-06-07 MED FILL — traZODone HCL 100 MG TABS: 100 | 90 days supply | Qty: 90 | Fill #0

## 2019-06-07 MED FILL — ALPRAZolam 1 MG TABS: 1 | 30 days supply | Qty: 45 | Fill #0

## 2019-07-02 MED FILL — METOPROLOL SUCCINATE ER 25: 25 | 30 days supply | Qty: 30 | Fill #1

## 2019-07-18 MED FILL — ALPRAZolam 1 MG TABS: 1 | 30 days supply | Qty: 45 | Fill #1

## 2019-07-26 DIAGNOSIS — I10 Essential (primary) hypertension: Secondary | ICD-10-CM | POA: Diagnosis not present

## 2019-07-26 DIAGNOSIS — Z6825 Body mass index (BMI) 25.0-25.9, adult: Secondary | ICD-10-CM | POA: Diagnosis not present

## 2019-07-26 DIAGNOSIS — F4323 Adjustment disorder with mixed anxiety and depressed mood: Secondary | ICD-10-CM | POA: Diagnosis not present

## 2019-07-26 DIAGNOSIS — G43909 Migraine, unspecified, not intractable, without status migrainosus: Secondary | ICD-10-CM | POA: Diagnosis not present

## 2019-07-26 MED FILL — ONDANSETRON HCL 8 MG TABLET: 8 | 5 days supply | Qty: 10 | Fill #0

## 2019-07-26 MED FILL — METOPROLOL SUCCINATE ER 100: 100 | 30 days supply | Qty: 30 | Fill #0

## 2019-07-26 MED FILL — FLUoxetine HCL 20 MG CAPS: 20 | 90 days supply | Qty: 90 | Fill #0

## 2019-07-26 MED FILL — EMGALITY 120 MG/ML SOAJ: 120 | 30 days supply | Qty: 1 | Fill #0

## 2019-07-26 MED FILL — LAMOTRIGINE 25 MG TABS: 25 | 90 days supply | Qty: 180 | Fill #0

## 2019-07-27 MED FILL — UBRELVY 100 MG TABS: 100 | 33 days supply | Qty: 10 | Fill #0

## 2019-08-23 MED FILL — ALPRAZolam 1 MG TABS: 1 | 30 days supply | Qty: 45 | Fill #2

## 2019-08-23 MED FILL — EMGALITY 120 MG/ML SOAJ: 120 | 30 days supply | Qty: 1 | Fill #1

## 2019-08-23 MED FILL — METOPROLOL SUCCINATE ER 100: 100 | 30 days supply | Qty: 30 | Fill #1

## 2019-09-03 MED FILL — UBRELVY 100 MG TABS: 100 | 33 days supply | Qty: 10 | Fill #1

## 2019-09-08 IMAGING — MR MR FOOT*L* WO/W CM
4 of 10 series · 19 of 40 positions shown · IV contrast (Yes)
Comparison: None.

CLINICAL DATA: Chronic pain at the base of the first MTP joint and
heel.

EXAM:
MRI OF THE LEFT FOREFOOT WITHOUT AND WITH CONTRAST
TECHNIQUE: Multiplanar, multisequence MR imaging of the left foot was performed
both before and after administration of intravenous contrast.
CONTRAST:  6 mL Gadavist intravenous contrast.

[Series 4: T1 · axial · 3.0mm · 0.47mm/px · z∈[-67,+42]mm · 3 of 29 slices shown (1 of 2)]
[im 1/29]
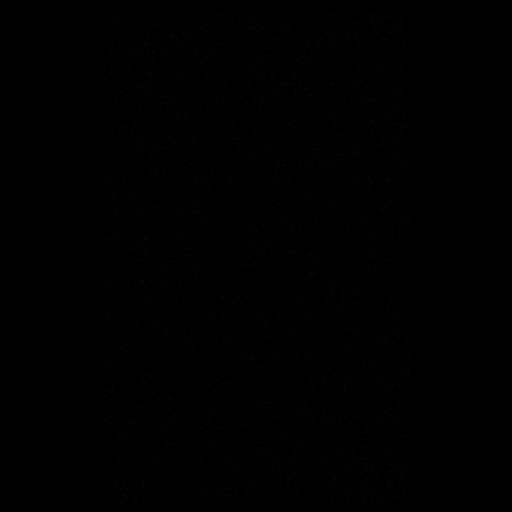
[im 15/29]
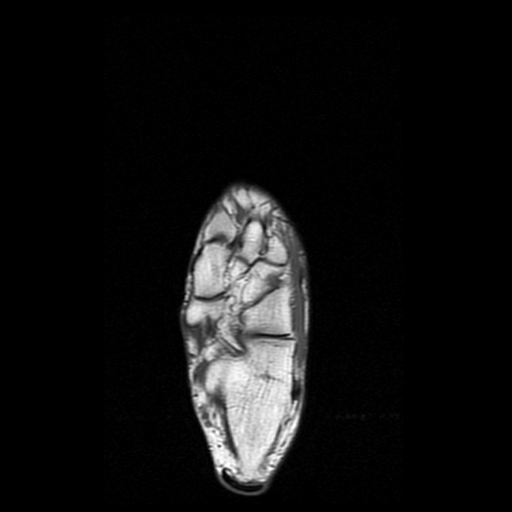
[im 29/29]
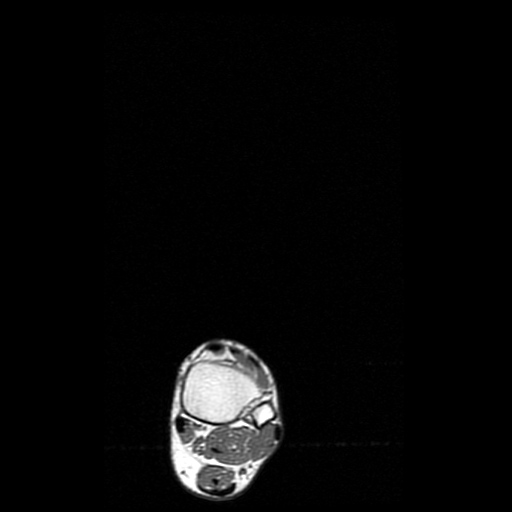

[Series 5: T1 · coronal · 3.0mm · 0.33mm/px · 6 of 60 slices shown (2 of 2)]
[im 1/60]
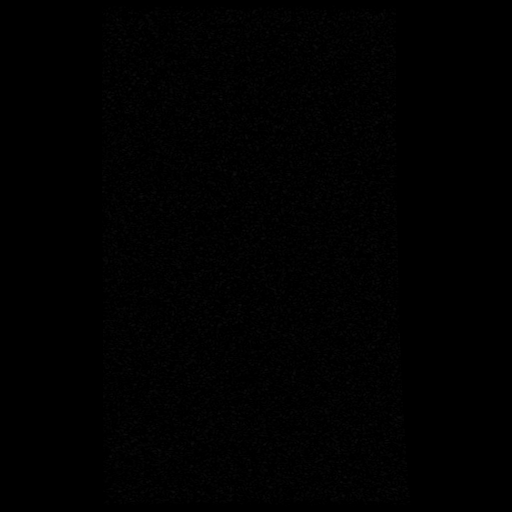
[im 12/60]
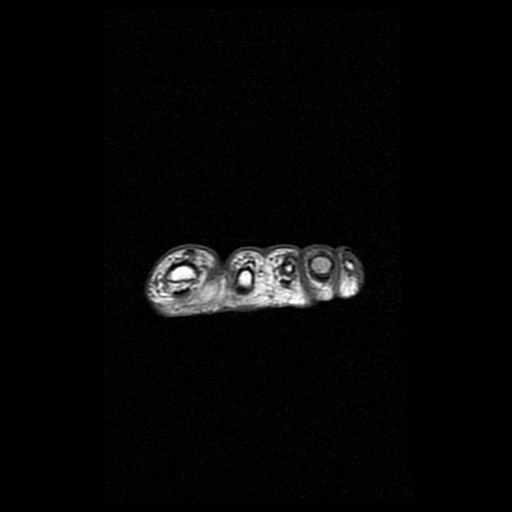
[im 24/60]
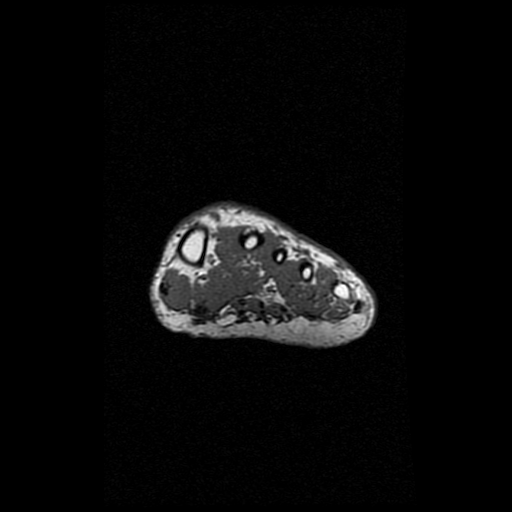
[im 36/60]
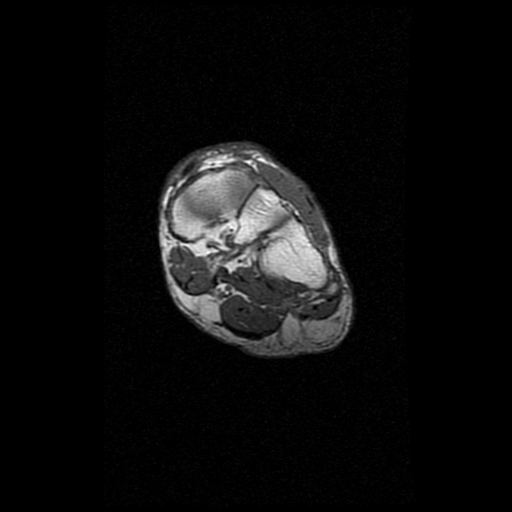
[im 48/60]
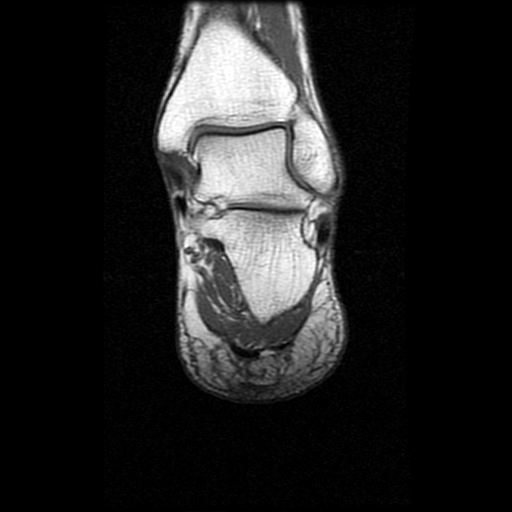
[im 60/60]
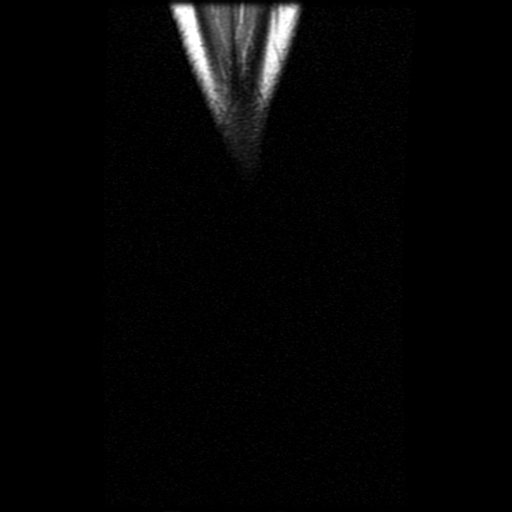

[Series 8: T1 fat-sat · coronal · non-contrast · 3.0mm · 0.33mm/px · 6 of 60 slices shown]
[im 1/60]
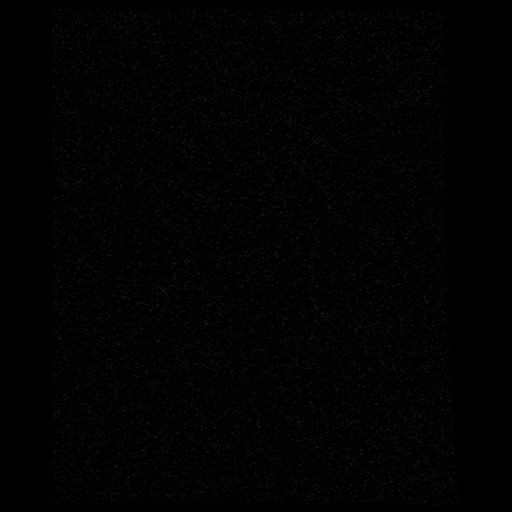
[im 12/60]
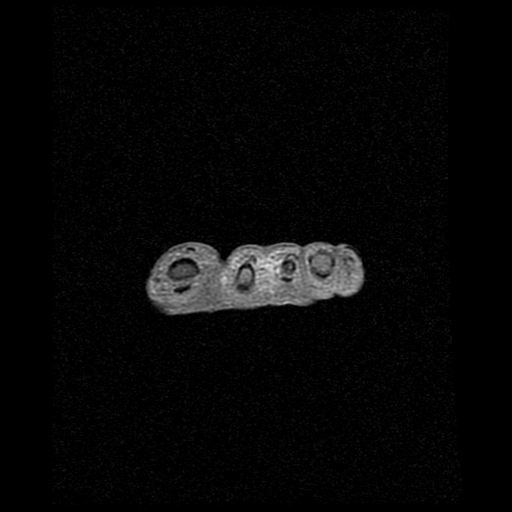
[im 24/60]
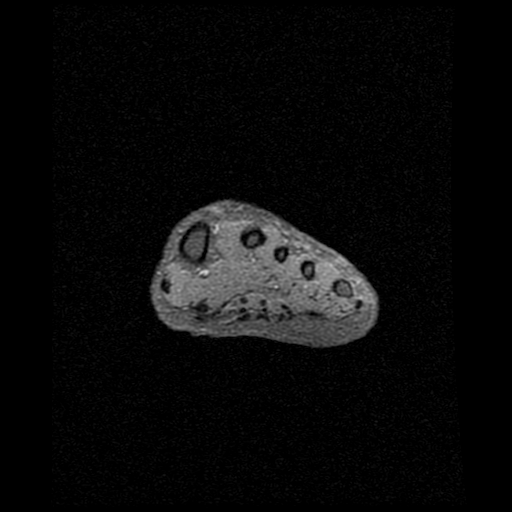
[im 36/60]
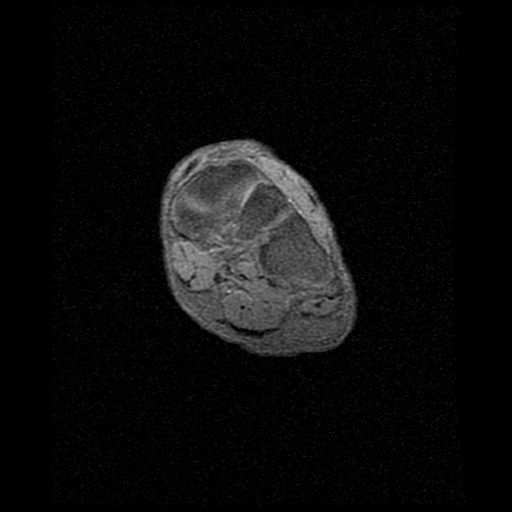
[im 48/60]
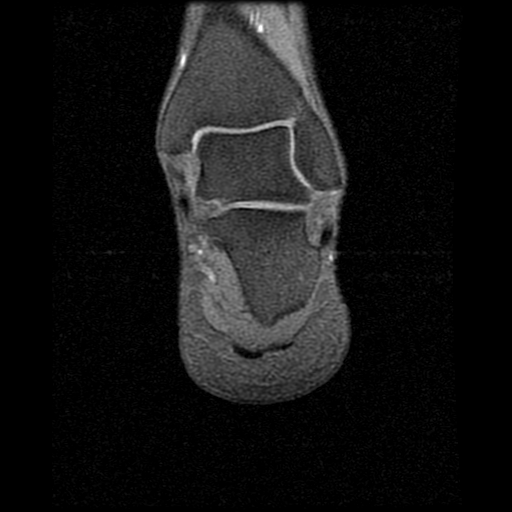
[im 60/60]
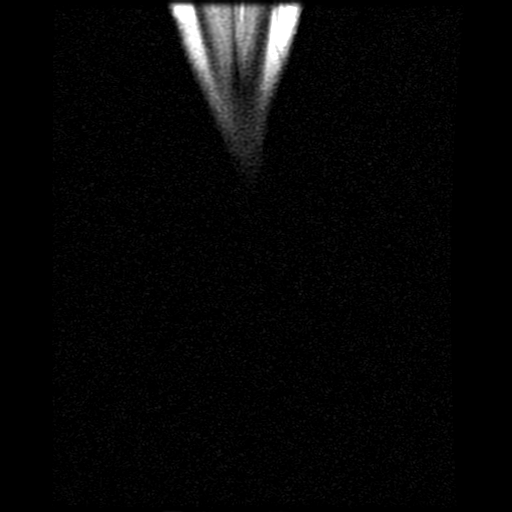

[Series 10: T1 fat-sat post-contrast · coronal · 3.0mm · 0.33mm/px · 4 of 60 slices shown]
[im 1/60]
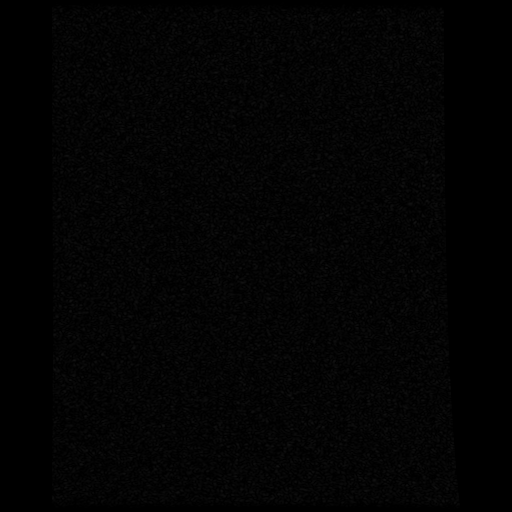
[im 12/60]
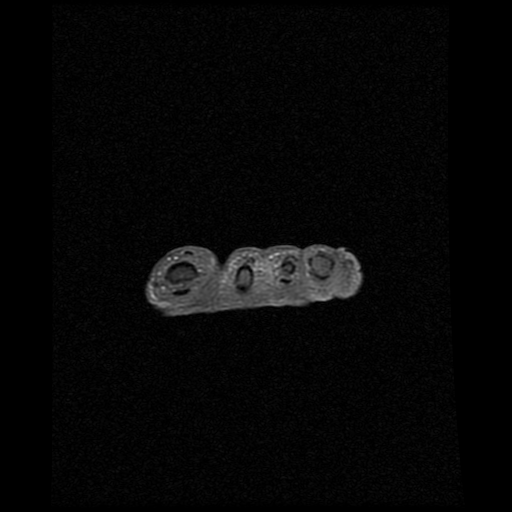
[im 36/60]
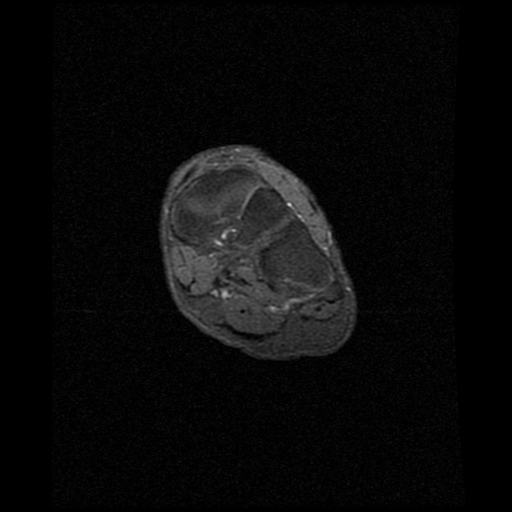
[im 60/60]
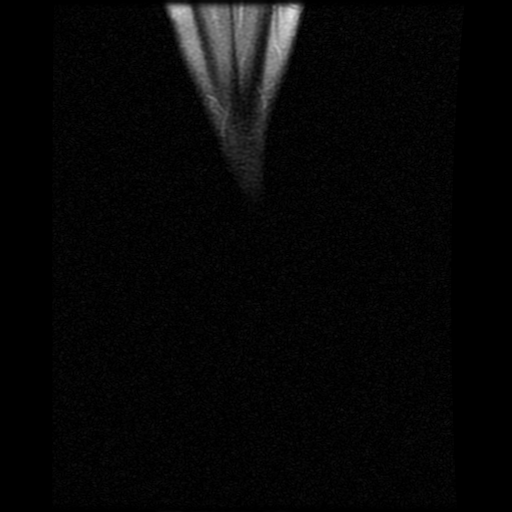

[19 of 40 positions shown; findings below may reference images not displayed]

FINDINGS: Bones/Joint/Cartilage

No marrow signal abnormality. No fracture or dislocation. Normal
alignment. Small first MTP joint effusion.

Ligaments

The plantar plate is intact. Collateral ligaments are intact.
Lisfranc ligament is intact. Deltoid and spring ligaments are
intact. Anterior and posterior tibiofibular, anterior and posterior
talofibular, and calcaneofibular ligaments are intact.

Muscles and Tendons
Flexor, peroneal and extensor compartment tendons are intact.
Achilles tendon is intact. Plantar fascia is intact. No muscle edema
or atrophy.

Soft tissue
Rim enhancing focal fluid collection at the plantar base of the
first metatarsal head, measuring 2.1 x 2.1 x 0.6 cm. No soft tissue
mass.
IMPRESSION: 1. 2.1 cm rim enhancing focal fluid collection at the plantar base
of the first metatarsal head, suspicious for adventitial bursitis.
Normal sesamoids and plantar plate.
2. Normal plantar fascia.

## 2019-09-17 MED FILL — traZODone HCL 100 MG TABS: 100 | 90 days supply | Qty: 90 | Fill #0

## 2019-09-17 MED FILL — METOPROLOL SUCCINATE ER 100: 100 | 30 days supply | Qty: 30 | Fill #2

## 2019-09-17 MED FILL — OMEPRAZOLE 40 MG CPDR: 40 | 90 days supply | Qty: 90 | Fill #0

## 2019-10-08 MED FILL — EMGALITY 120 MG/ML SOAJ: 120 | 30 days supply | Qty: 1 | Fill #2

## 2019-10-08 MED FILL — UBRELVY 100 MG TABS: 100 | 33 days supply | Qty: 10 | Fill #2

## 2019-10-21 MED FILL — METOPROLOL SUCCINATE ER 100: 100 | 30 days supply | Qty: 30 | Fill #3

## 2019-10-21 MED FILL — LAMOTRIGINE 25 MG TABS: 25 | 90 days supply | Qty: 180 | Fill #1

## 2019-10-21 MED FILL — FLUoxetine HCL 20 MG CAPS: 20 | 90 days supply | Qty: 90 | Fill #1

## 2019-11-07 MED FILL — EMGALITY 120 MG/ML SOAJ: 120 | 30 days supply | Qty: 1 | Fill #3

## 2019-11-12 ENCOUNTER — Ambulatory Visit: Payer: 59 | Admitting: Obstetrics & Gynecology

## 2019-11-12 ENCOUNTER — Other Ambulatory Visit: Payer: Self-pay

## 2019-11-12 ENCOUNTER — Encounter: Payer: Self-pay | Admitting: Obstetrics & Gynecology

## 2019-11-12 VITALS — BP 130/86 | Ht 63.75 in | Wt 144.0 lb

## 2019-11-12 DIAGNOSIS — Z01419 Encounter for gynecological examination (general) (routine) without abnormal findings: Secondary | ICD-10-CM | POA: Diagnosis not present

## 2019-11-12 DIAGNOSIS — Z90710 Acquired absence of both cervix and uterus: Secondary | ICD-10-CM | POA: Diagnosis not present

## 2019-11-12 DIAGNOSIS — N951 Menopausal and female climacteric states: Secondary | ICD-10-CM

## 2019-11-12 NOTE — Patient Instructions (Signed)
1. Well female exam with routine gynecological exam Gynecologic exam s/p Total Hysterectomy.  Pap negative 04/2018.  No indication to repeat Pap test this year.  Breasts normal.  [facility]  Schedule Colonoscopy through her Fam MD.  Health Labs with Fam MD.  BMI 24.91.  Aerobic activities 5 times a week and light weightlifting every 2 days recommended.  2. S/P total hysterectomy  3. Menopausal syndrome Worse H/As when tried Estradiol patch.  Already on Fluoxetine.  Will try Black Cohash.  Increase physical activities.  Vit D supplements, Ca++ intake of 1200 mg daily recommended.  Other orders - metoprolol succinate (TOPROL-XL) 100 MG 24 hr tablet; Take 100 mg by mouth daily. Take with or immediately following a meal.  Prisma, it was a pleasure seeing you today

## 2019-11-12 NOTE — Progress Notes (Signed)
Sherry Dunn [birth year] [id number]   History:    49 y.o. G2P1A1L1  Remarried.  Daughter 11 yo [employer].  RP:  Established patient presenting for annual gyn exam   HPI: S/P Robotic TLH/Bilateral Salpingectomy, fulguration of pelvic Endometriosis 03/24/2015.  No abdominopelvic pain.  Complains of worsening night sweats, mild hot flushes and decreased libido.  No pain with intercourse using coconut oil.  [place] 130.8 in 01/2018.  Urine and bowel movements normal.  Breasts normal.  Health labs with family physician.  Body mass index 24.91.  Needs to increase physical activity.  Anxiety on medication.   Past medical history,surgical history, family history and social history were all reviewed and documented in the EPIC chart.  Gynecologic History Patient's last menstrual period was 03/10/2015.  Obstetric History OB History  Gravida Para Term Preterm AB Living  2 1     1 1   SAB TAB Ectopic Multiple Live Births  1            # Outcome Date GA Lbr Len/2nd Weight Sex Delivery Anes PTL Lv  2 SAB           1 Para              ROS: A ROS was performed and pertinent positives and negatives are included in the history.  GENERAL: No fevers or chills. HEENT: No change in vision, no earache, sore throat or sinus congestion. NECK: No pain or stiffness. CARDIOVASCULAR: No chest pain or pressure. No palpitations. PULMONARY: No shortness of breath, cough or wheeze. GASTROINTESTINAL: No abdominal pain, nausea, vomiting or diarrhea, melena or bright red blood per rectum. GENITOURINARY: No urinary frequency, urgency, hesitancy or dysuria. MUSCULOSKELETAL: No joint or muscle pain, no back pain, no recent trauma. DERMATOLOGIC: No rash, no itching, no lesions. ENDOCRINE: No polyuria, polydipsia, no heat or cold intolerance. No recent change in weight. HEMATOLOGICAL: No anemia or easy bruising or bleeding. NEUROLOGIC: No headache, seizures, numbness, tingling or weakness. PSYCHIATRIC: No depression, no loss of interest  in normal activity or change in sleep pattern.     Exam:   BP 130/86   Ht 5' 3.75" (1.619 m)   Wt 144 lb (65.3 kg)   LMP 03/10/2015   BMI 24.91 kg/m   Body mass index is 24.91 kg/m.  General appearance : Well developed well nourished female. No acute distress HEENT: Eyes: no retinal hemorrhage or exudates,  Neck supple, trachea midline, no carotid bruits, no thyroidmegaly Lungs: Clear to auscultation, no rhonchi or wheezes, or rib retractions  Heart: Regular rate and rhythm, no murmurs or gallops Breast:Examined in sitting and supine position were symmetrical in appearance, no palpable masses or tenderness,  no skin retraction, no nipple inversion, no nipple discharge, no skin discoloration, no axillary or supraclavicular lymphadenopathy Abdomen: no palpable masses or tenderness, no rebound or guarding Extremities: no edema or skin discoloration or tenderness  Pelvic: Vulva: Normal             Vagina: No gross lesions or discharge  Cervix/Uterus absent  Adnexa  Without masses or tenderness  Anus: Normal   Assessment/Plan:  49 y.o. female for annual exam   1. Well female exam with routine gynecological exam Gynecologic exam s/p Total Hysterectomy.  Pap negative 04/2018.  No indication to repeat Pap test this year.  Breasts normal.  Will schedule a Screening Mammo at the [facility].  Schedule Colonoscopy through her Fam MD.  Health Labs with Fam MD.  BMI 24.91.  Aerobic  activities 5 times a week and light weightlifting every 2 days recommended.  2. S/P total hysterectomy  3. Menopausal syndrome Worse H/As when tried Estradiol patch.  Already on Fluoxetine.  Will try Black Cohash.  Increase physical activities.  Vit D supplements, Ca++ intake of 1200 mg daily recommended.  Other orders - metoprolol succinate (TOPROL-XL) 100 MG 24 hr tablet; Take 100 mg by mouth daily. Take with or immediately following a meal.  Genia Del MD, 12:04 PM 11/12/2019

## 2019-11-15 MED FILL — ALPRAZolam 1 MG TABS: 1 | 45 days supply | Qty: 45 | Fill #0

## 2019-11-15 MED FILL — UBRELVY 100 MG TABS: 100 | 33 days supply | Qty: 10 | Fill #3

## 2019-11-22 DIAGNOSIS — Z6825 Body mass index (BMI) 25.0-25.9, adult: Secondary | ICD-10-CM | POA: Diagnosis not present

## 2019-11-22 DIAGNOSIS — L308 Other specified dermatitis: Secondary | ICD-10-CM | POA: Diagnosis not present

## 2019-11-22 MED FILL — TRIAMCINOLONE ACETONIDE 0.1: 0.1 | 30 days supply | Qty: 80 | Fill #0

## 2019-11-23 MED FILL — METOPROLOL SUCCINATE ER 100: 100 | 30 days supply | Qty: 30 | Fill #4

## 2019-12-03 MED FILL — ONDANSETRON HCL 8 MG TABLET: 8 | 5 days supply | Qty: 10 | Fill #1

## 2019-12-04 MED FILL — EMGALITY 120 MG/ML SOAJ: 120 | 30 days supply | Qty: 1 | Fill #4

## 2019-12-24 MED FILL — traZODone HCL 100 MG TABS: 100 | 90 days supply | Qty: 90 | Fill #1

## 2019-12-24 MED FILL — METOPROLOL SUCCINATE ER 100: 100 | 30 days supply | Qty: 30 | Fill #5

## 2020-01-08 MED FILL — OMEPRAZOLE 40 MG CPDR: 40 | 90 days supply | Qty: 90 | Fill #1

## 2020-01-08 MED FILL — EMGALITY 120 MG/ML SOAJ: 120 | 30 days supply | Qty: 1 | Fill #5

## 2020-01-08 MED FILL — ONDANSETRON HCL 8 MG TABLET: 8 | 5 days supply | Qty: 10 | Fill #2

## 2020-01-18 IMAGING — CT CT HEAD WITHOUT CONTRAST
3 of 4 series · 16 of 47 positions shown, 19 images · non-contrast
Comparison: None.

CLINICAL DATA: Progressing headaches

EXAM:
CT HEAD WITHOUT CONTRAST
TECHNIQUE: Contiguous axial images were obtained from the base of the skull
through the vertex without intravenous contrast.

[Series 2: head 5.00 hr40 s3 axial ibhc · axial · 0.43mm/px · z∈[-548,-413]mm · 10 of 33 slices shown, 13 images]
[im 3/33  brain]
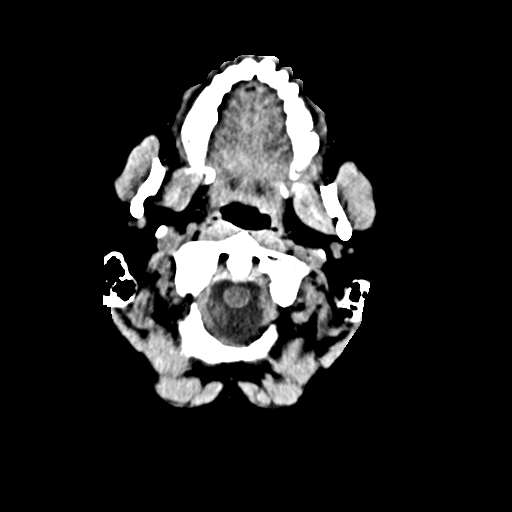
[im 3/33  bone]
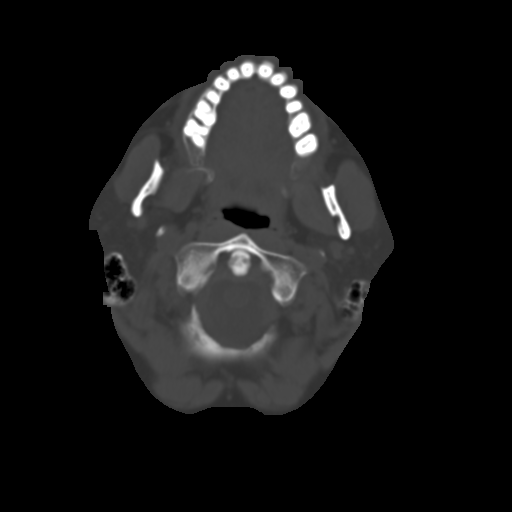
[im 5/33  brain]
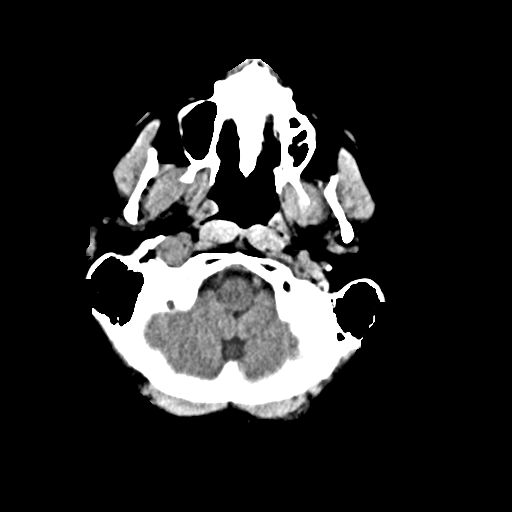
[im 10/33  brain]
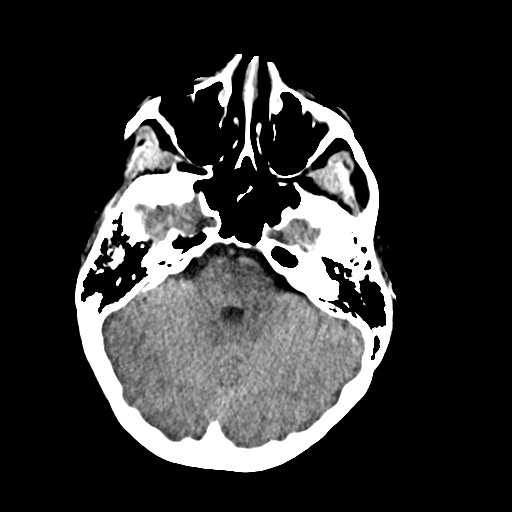
[im 12/33  brain]
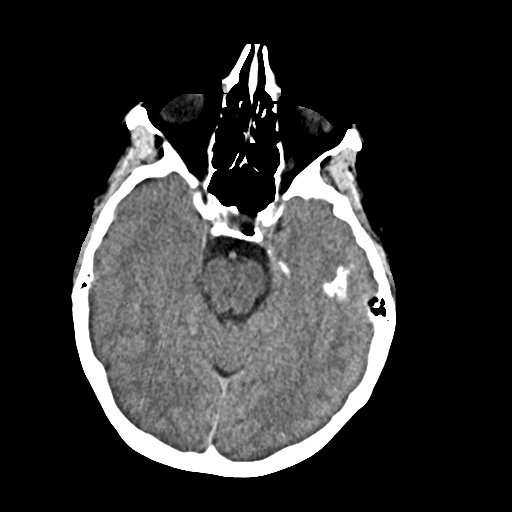
[im 14/33  brain]
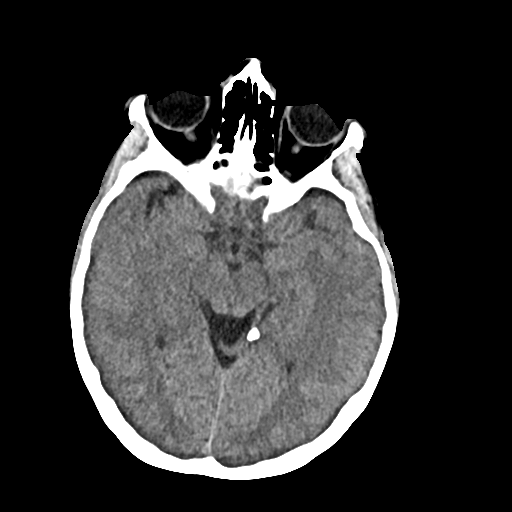
[im 14/33  bone]
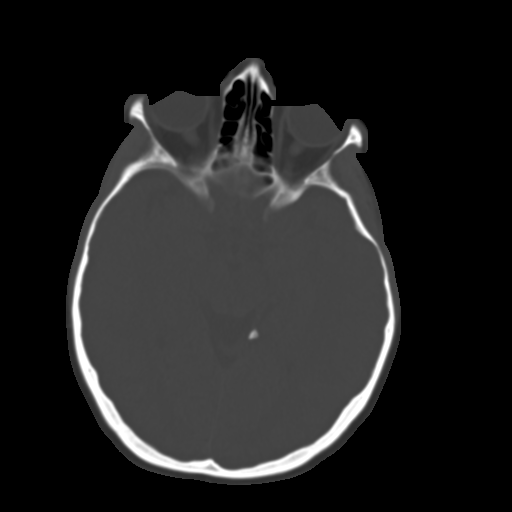
[im 19/33  brain]
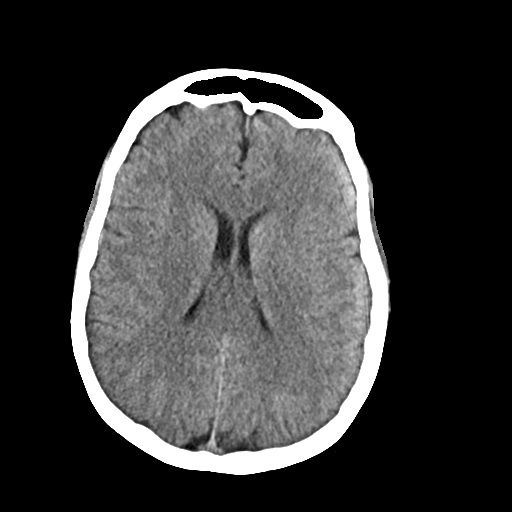
[im 21/33  brain]
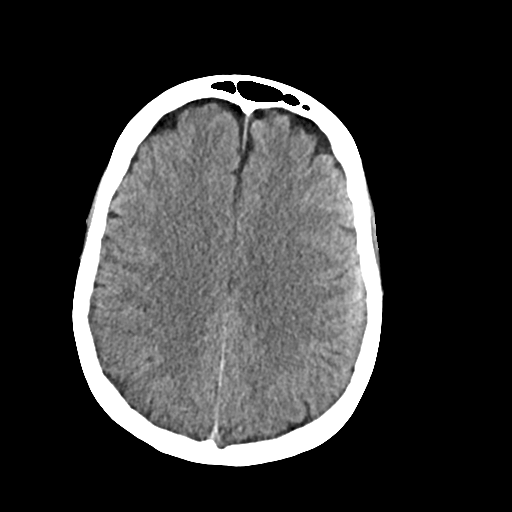
[im 23/33  brain]
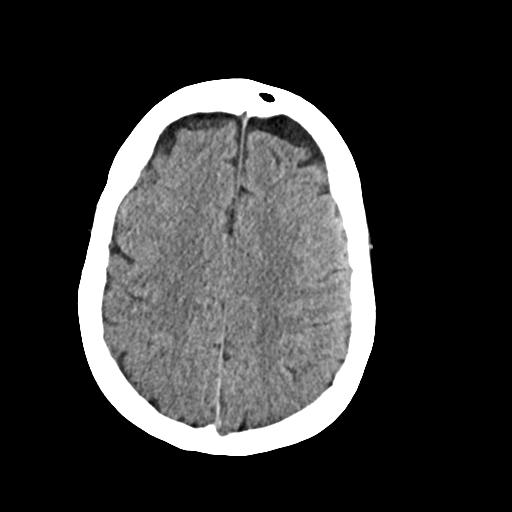
[im 28/33  brain]
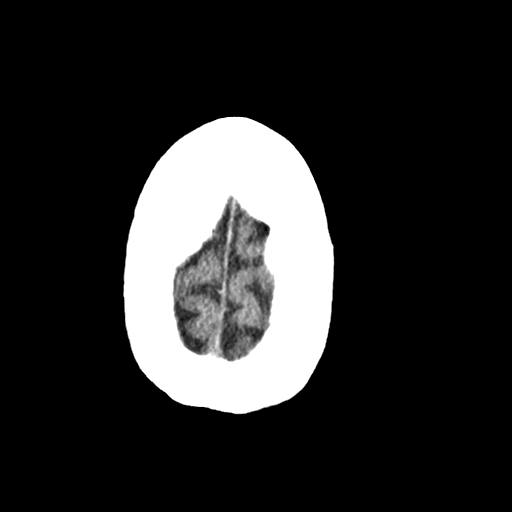
[im 28/33  bone]
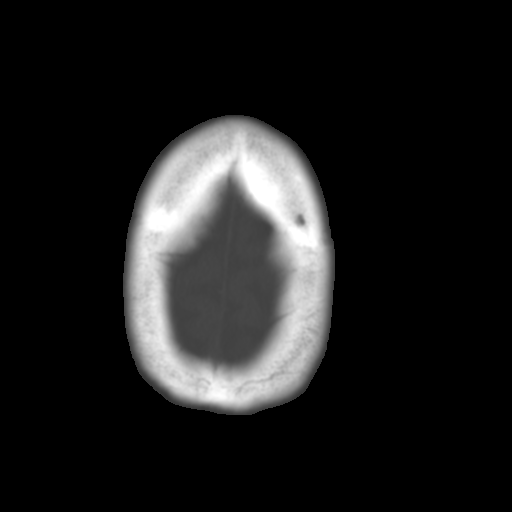
[im 30/33  brain]
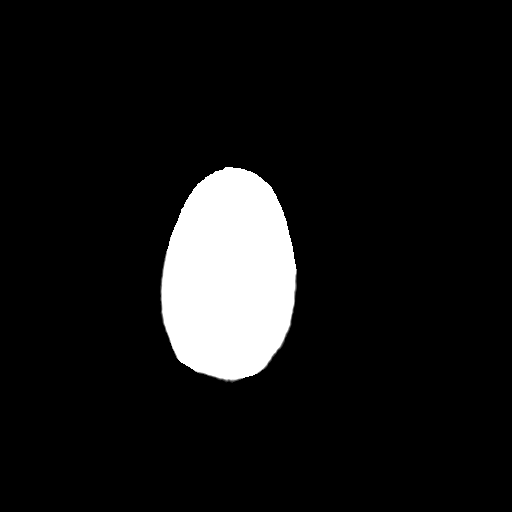

[Series 4: head 3.00 hr40 s3 sag · sagittal · 0.31mm/px · 3 of 59 slices shown]
[im 20/59  brain]
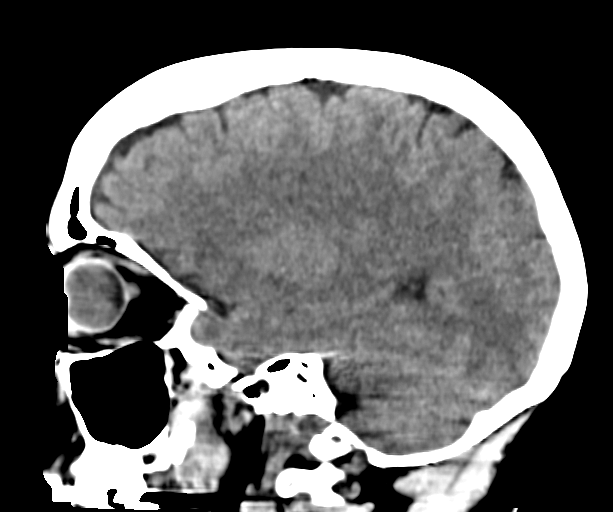
[im 30/59  brain]
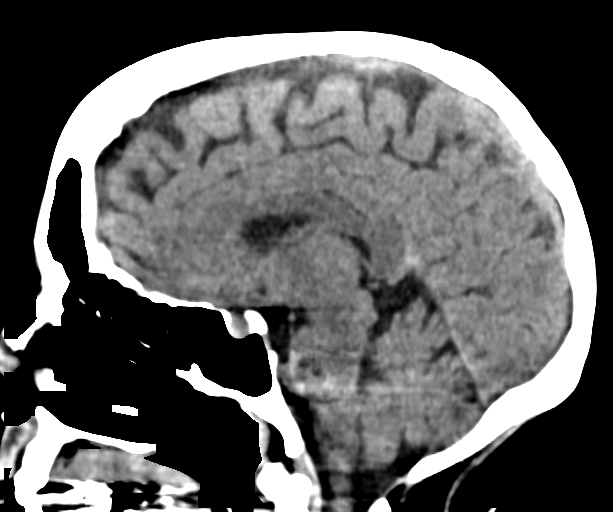
[im 39/59  brain]
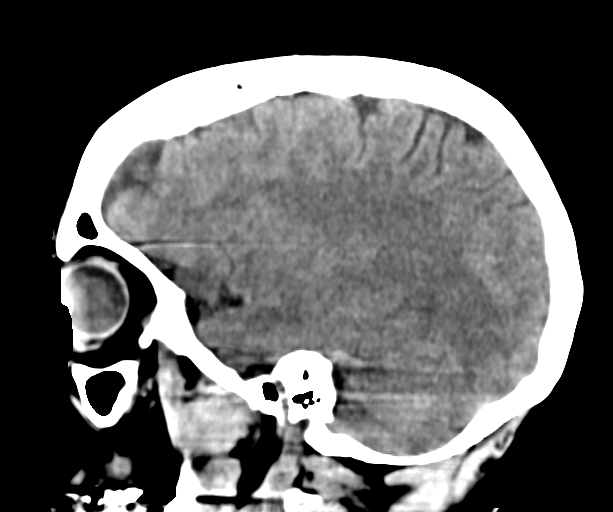

[Series 6: head 3.00 hr40 s3 cor · coronal · 0.31mm/px · 3 of 64 slices shown]
[im 22/64  brain]
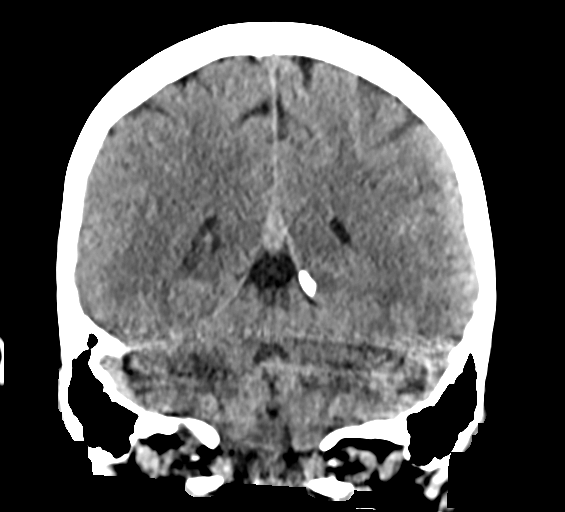
[im 29/64  brain]
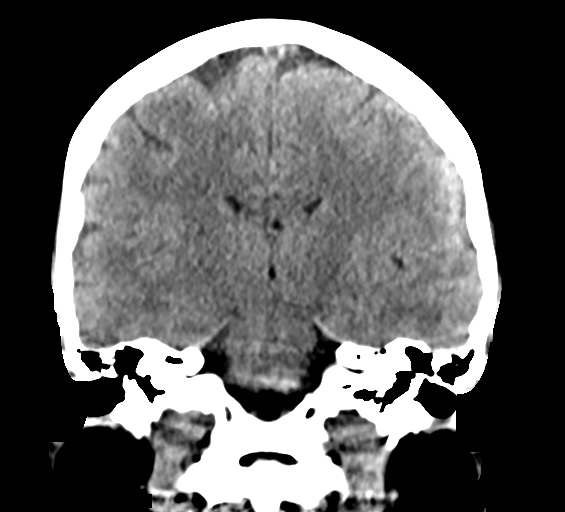
[im 36/64  brain]
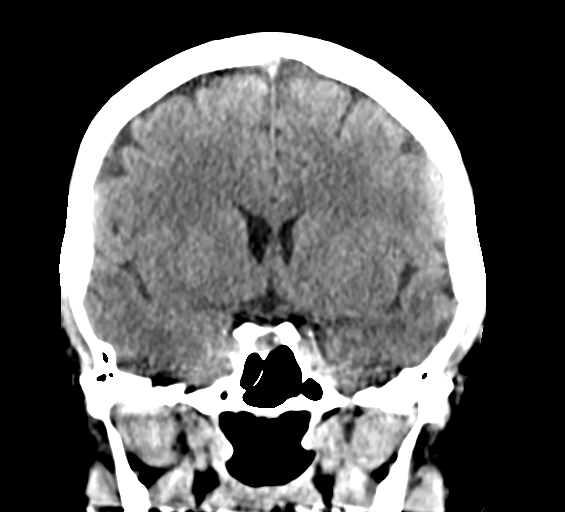

[16 of 47 positions shown; findings below may reference images not displayed]

FINDINGS: Brain: Ventricles are normal in size and configuration. There is no
intracranial mass, hemorrhage, extra-axial fluid collection, or
midline shift. The brain parenchyma appears unremarkable. There is
no evident acute infarct.

Vascular: There is no hyperdense vessel. There is no evident
vascular calcification.

Skull: The bony calvarium appears intact.

Sinuses/Orbits: Paranasal sinuses are clear. Orbits appear symmetric
bilaterally.

Other: Mastoid air cells are clear.
IMPRESSION: Study within normal limits.

## 2020-02-04 MED FILL — UBRELVY 100 MG TABS: 100 | 30 days supply | Qty: 10 | Fill #5

## 2020-02-04 MED FILL — ONDANSETRON HCL 8 MG TABLET: 8 | 5 days supply | Qty: 10 | Fill #3

## 2020-02-04 MED FILL — EMGALITY 120 MG/ML SOAJ: 120 | 30 days supply | Qty: 1 | Fill #6

## 2020-02-04 MED FILL — ALPRAZolam 1 MG TABS: 1 | 22 days supply | Qty: 45 | Fill #2

## 2020-02-25 ENCOUNTER — Other Ambulatory Visit (HOSPITAL_COMMUNITY): Payer: Self-pay | Admitting: Family Medicine

## 2020-02-25 MED FILL — METOPROLOL SUCCINATE ER 100: 100 | 30 days supply | Qty: 30 | Fill #0

## 2020-03-05 MED FILL — EMGALITY 120 MG/ML SOAJ: 120 | 30 days supply | Qty: 1 | Fill #0

## 2020-03-05 MED FILL — TRIAMCINOLONE ACETONIDE 0.1: 0.1 | 30 days supply | Qty: 80 | Fill #1

## 2020-03-05 MED FILL — UBRELVY 100 MG TABS: 100 | 30 days supply | Qty: 10 | Fill #6

## 2020-03-24 MED FILL — METOPROLOL SUCCINATE ER 100: 100 | 30 days supply | Qty: 30 | Fill #1

## 2020-04-01 DIAGNOSIS — Z20828 Contact with and (suspected) exposure to other viral communicable diseases: Secondary | ICD-10-CM | POA: Diagnosis not present

## 2020-04-09 MED FILL — traZODone HCL 100 MG TABS: 100 | 90 days supply | Qty: 90 | Fill #2

## 2020-04-09 MED FILL — ONDANSETRON HCL 8 MG TABLET: 8 | 5 days supply | Qty: 10 | Fill #4

## 2020-04-09 MED FILL — OMEPRAZOLE 40 MG CPDR: 40 | 90 days supply | Qty: 90 | Fill #2

## 2020-04-10 ENCOUNTER — Other Ambulatory Visit (HOSPITAL_COMMUNITY): Payer: Self-pay | Admitting: Family Medicine

## 2020-04-10 MED FILL — LAMOTRIGINE 25 MG TABS: 25 | 90 days supply | Qty: 180 | Fill #3

## 2020-04-10 MED FILL — UBRELVY 100 MG TABS: 100 | 30 days supply | Qty: 9 | Fill #0

## 2020-04-10 MED FILL — FLUoxetine HCL 20 MG CAPS: 20 | 90 days supply | Qty: 90 | Fill #3

## 2020-04-10 MED FILL — EMGALITY 120 MG/ML SOAJ: 120 | 30 days supply | Qty: 1 | Fill #1

## 2020-04-28 ENCOUNTER — Other Ambulatory Visit (HOSPITAL_COMMUNITY): Payer: Self-pay | Admitting: Family Medicine

## 2020-04-28 MED FILL — ALPRAZOLAM 1 MG TABS: 1 | 30 days supply | Qty: 45 | Fill #0

## 2020-05-05 MED FILL — METOPROLOL SUCCINATE ER 100: 100 | 30 days supply | Qty: 30 | Fill #2

## 2020-05-07 DIAGNOSIS — G43109 Migraine with aura, not intractable, without status migrainosus: Secondary | ICD-10-CM | POA: Diagnosis not present

## 2020-05-07 DIAGNOSIS — Z6824 Body mass index (BMI) 24.0-24.9, adult: Secondary | ICD-10-CM | POA: Diagnosis not present

## 2020-05-09 ENCOUNTER — Other Ambulatory Visit: Payer: Self-pay

## 2020-05-09 ENCOUNTER — Inpatient Hospital Stay: Payer: 59 | Attending: Internal Medicine

## 2020-05-09 DIAGNOSIS — Z23 Encounter for immunization: Secondary | ICD-10-CM

## 2020-05-11 MED FILL — ONDANSETRON HCL 8 MG TABLET: 8 | 5 days supply | Qty: 10 | Fill #5

## 2020-05-12 MED FILL — EMGALITY 120 MG/ML SOAJ: 120 | 30 days supply | Qty: 1 | Fill #2

## 2020-05-12 MED FILL — UBRELVY 100 MG TABS: 100 | 30 days supply | Qty: 9 | Fill #1

## 2020-06-05 DIAGNOSIS — Z6824 Body mass index (BMI) 24.0-24.9, adult: Secondary | ICD-10-CM | POA: Diagnosis not present

## 2020-06-05 DIAGNOSIS — L308 Other specified dermatitis: Secondary | ICD-10-CM | POA: Diagnosis not present

## 2020-06-11 MED FILL — ONDANSETRON HCL 8 MG TABLET: 8 | 5 days supply | Qty: 10 | Fill #6

## 2020-06-11 MED FILL — EMGALITY 120 MG/ML SOAJ: 120 | 30 days supply | Qty: 1 | Fill #3

## 2020-06-11 MED FILL — UBRELVY 100 MG TABS: 100 | 30 days supply | Qty: 9 | Fill #2

## 2020-07-23 MED FILL — ALPRAZOLAM 1 MG TABS: 1 | 30 days supply | Qty: 45 | Fill #2

## 2020-07-24 MED FILL — UBRELVY 100 MG TABS: 100 | 30 days supply | Qty: 9 | Fill #3

## 2020-08-27 MED FILL — ALPRAZOLAM 1 MG TABS: 1 | 30 days supply | Qty: 45 | Fill #3

## 2020-11-04 ENCOUNTER — Other Ambulatory Visit (HOSPITAL_BASED_OUTPATIENT_CLINIC_OR_DEPARTMENT_OTHER): Payer: Self-pay

## 2021-08-06 ENCOUNTER — Other Ambulatory Visit (HOSPITAL_COMMUNITY): Payer: Self-pay

## 2023-01-01 DIAGNOSIS — N3001 Acute cystitis with hematuria: Secondary | ICD-10-CM | POA: Diagnosis not present

## 2023-03-01 DIAGNOSIS — F4323 Adjustment disorder with mixed anxiety and depressed mood: Secondary | ICD-10-CM | POA: Diagnosis not present

## 2023-03-01 DIAGNOSIS — Z1331 Encounter for screening for depression: Secondary | ICD-10-CM | POA: Diagnosis not present

## 2023-03-01 DIAGNOSIS — M797 Fibromyalgia: Secondary | ICD-10-CM | POA: Diagnosis not present

## 2023-03-01 DIAGNOSIS — G43909 Migraine, unspecified, not intractable, without status migrainosus: Secondary | ICD-10-CM | POA: Diagnosis not present

## 2023-04-20 DIAGNOSIS — N3001 Acute cystitis with hematuria: Secondary | ICD-10-CM | POA: Diagnosis not present

## 2023-05-03 DIAGNOSIS — F4323 Adjustment disorder with mixed anxiety and depressed mood: Secondary | ICD-10-CM | POA: Diagnosis not present

## 2023-05-03 DIAGNOSIS — I10 Essential (primary) hypertension: Secondary | ICD-10-CM | POA: Diagnosis not present

## 2023-05-03 DIAGNOSIS — G43909 Migraine, unspecified, not intractable, without status migrainosus: Secondary | ICD-10-CM | POA: Diagnosis not present

## 2023-05-03 DIAGNOSIS — Z6825 Body mass index (BMI) 25.0-25.9, adult: Secondary | ICD-10-CM | POA: Diagnosis not present

## 2023-07-20 ENCOUNTER — Ambulatory Visit (INDEPENDENT_AMBULATORY_CARE_PROVIDER_SITE_OTHER): Payer: BC Managed Care – PPO | Admitting: Neurology

## 2023-07-20 ENCOUNTER — Encounter: Payer: Self-pay | Admitting: Neurology

## 2023-07-20 VITALS — BP 146/89 | HR 69 | Ht 64.0 in | Wt 146.4 lb

## 2023-07-20 DIAGNOSIS — G43711 Chronic migraine without aura, intractable, with status migrainosus: Secondary | ICD-10-CM | POA: Diagnosis not present

## 2023-07-20 DIAGNOSIS — R519 Headache, unspecified: Secondary | ICD-10-CM | POA: Diagnosis not present

## 2023-07-20 DIAGNOSIS — H9192 Unspecified hearing loss, left ear: Secondary | ICD-10-CM

## 2023-07-20 DIAGNOSIS — H9312 Tinnitus, left ear: Secondary | ICD-10-CM

## 2023-07-20 DIAGNOSIS — R42 Dizziness and giddiness: Secondary | ICD-10-CM | POA: Diagnosis not present

## 2023-07-20 MED ORDER — NURTEC 75 MG PO TBDP: 75.0000 mg | ORAL_TABLET | Freq: Every day | ORAL | 0 refills | Status: DC | PRN

## 2023-07-20 MED ORDER — AJOVY 225 MG/1.5ML ~~LOC~~ SOAJ: 225.0000 mg | SUBCUTANEOUS | 0 refills | Status: AC

## 2023-07-20 MED ORDER — AJOVY 225 MG/1.5ML ~~LOC~~ SOAJ: 225.0000 mg | SUBCUTANEOUS | 11 refills | Status: DC

## 2023-07-20 NOTE — Patient Instructions (Addendum)
MRI brain w/wo contrast:  Preventative: could try CGRP medications: Emgality again or [name] injections. Also Qulipta. Vyepti.  Botox. I love to use Botox and a CGRP medication. Consider botox in the future.  Acute: Nurtec samples and Ifyou liked them I could prescribe OR if you liked the ubrelvy better I could prescribe that.  Plan: Start [name] for prevention. Nurtec samples take one as needed for migraine. If you like it we can prescribe nurtec or ubrelvy if you think that worked better.  Rimegepant Disintegrating Tablets What is this medication? RIMEGEPANT (ri ME je pant) prevents and treats migraines. It works by blocking a substance in the body that causes migraines. This medicine may be used for other purposes; ask your health care provider or pharmacist if you have questions. COMMON BRAND NAME(S): NURTEC ODT What should I tell my care team before I take this medication? They need to know if you have any of these conditions: Kidney disease Liver disease An unusual or allergic reaction to rimegepant, other medications, foods, dyes, or preservatives Pregnant or trying to get pregnant Breast-feeding How should I use this medication? Take this medication by mouth. Take it as directed on the prescription label. Leave the tablet in the sealed pack until you are ready to take it. With dry hands, open the pack and gently remove the tablet. If the tablet breaks or crumbles, throw it away. Use a new tablet. Place the tablet in the mouth and allow it to dissolve. Then, swallow it. Do not cut, crush, or chew this medication. You do not need water to take this medication. Talk to your care team about the use of this medication in children. Special care may be needed. Overdosage: If you think you have taken too much of this medicine contact a poison control center or emergency room at once. NOTE: This medicine is only for you. Do not share this medicine with others. What if I miss a dose? This does  not apply. This medication is not for regular use. What may interact with this medication? Certain medications for fungal infections, such as fluconazole, itraconazole Rifampin This list may not describe all possible interactions. Give your health care provider a list of all the medicines, herbs, non-prescription drugs, or dietary supplements you use. Also tell them if you smoke, drink alcohol, or use illegal drugs. Some items may interact with your medicine. What should I watch for while using this medication? Visit your care team for regular checks on your progress. Tell your care team if your symptoms do not start to get better or if they get worse. What side effects may I notice from receiving this medication? Side effects that you should report to your care team as soon as possible: Allergic reactions--skin rash, itching, hives, swelling of the face, lips, tongue, or throat Side effects that usually do not require medical attention (report to your care team if they continue or are bothersome): Nausea Stomach pain This list may not describe all possible side effects. Call your doctor for medical advice about side effects. You may report side effects to FDA at 1-800-FDA-1088. Where should I keep my medication? Keep out of the reach of children and pets. Store at room temperature between 20 and 25 degrees C (68 and 77 degrees F). Get rid of any unused medication after the expiration date. To get rid of medications that are no longer needed or have expired: Take the medication to a medication take-back program. Check with your pharmacy or law enforcement  to find a location. If you cannot return the medication, check the label or package insert to see if the medication should be thrown out in the garbage or flushed down the toilet. If you are not sure, ask your care team. If it is safe to put it in the trash, take the medication out of the container. Mix the medication with cat litter, dirt,  coffee grounds, or other unwanted substance. Seal the mixture in a bag or container. Put it in the trash. NOTE: This sheet is a summary. It may not cover all possible information. If you have questions about this medicine, talk to your doctor, pharmacist, or health care provider.  2024 Elsevier/Gold Standard (2021-09-23 00:00:00) Vernell Barrier Injection What is this medication? FREMANEZUMAB (fre ma NEZ ue mab) prevents migraines. It works by blocking a substance in the body that causes migraines. It is a monoclonal antibody. This medicine may be used for other purposes; ask your health care provider or pharmacist if you have questions. COMMON BRAND NAME(S): [name] What should I tell my care team before I take this medication? They need to know if you have any of these conditions: An unusual or allergic reaction to fremanezumab, other medications, foods, dyes, or preservatives Pregnant or trying to get pregnant Breast-feeding How should I use this medication? This medication is injected under the skin. You will be taught how to prepare and give it. Take it as directed on the prescription label. Keep taking it unless your care team tells you to stop. It is important that you put your used needles and syringes in a special sharps container. Do not put them in a trash can. If you do not have a sharps container, call your pharmacist or care team to get one. Talk to your care team about the use of this medication in children. Special care may be needed. Overdosage: If you think you have taken too much of this medicine contact a poison control center or emergency room at once. NOTE: This medicine is only for you. Do not share this medicine with others. What if I miss a dose? If you miss a dose, take it as soon as you can. If it is almost time for your next dose, take only that dose. Do not take double or extra doses. What may interact with this medication? Interactions are not expected. This list may  not describe all possible interactions. Give your health care provider a list of all the medicines, herbs, non-prescription drugs, or dietary supplements you use. Also tell them if you smoke, drink alcohol, or use illegal drugs. Some items may interact with your medicine. What should I watch for while using this medication? Tell your care team if your symptoms do not start to get better or if they get worse. What side effects may I notice from receiving this medication? Side effects that you should report to your care team as soon as possible: Allergic reactions or angioedema--skin rash, itching or hives, swelling of the face, eyes, lips, tongue, arms, or legs, trouble swallowing or breathing Side effects that usually do not require medical attention (report to your care team if they continue or are bothersome): Pain, redness, or irritation at injection site This list may not describe all possible side effects. Call your doctor for medical advice about side effects. You may report side effects to FDA at 1-800-FDA-1088. Where should I keep my medication? Keep out of the reach of children and pets. Store in a refrigerator or at room temperature  between 20 and 25 degrees C (68 and 77 degrees F). Refrigeration (preferred): Store in the refrigerator. Do not freeze. Keep in the original container until you are ready to take it. Remove the dose from the carton about 30 minutes before it is time for you to use it. If the dose is not used, it may be stored in the original container at room temperature for 7 days. Get rid of any unused medication after the expiration date. Room Temperature: This medication may be stored at room temperature for up to 7 days. Keep it in the original container. Protect from light until time of use. If it is stored at room temperature, get rid of any unused medication after 7 days or after it expires, whichever is first. To get rid of medications that are no longer needed or have  expired: Take the medication to a medication take-back program. Check with your pharmacy or law enforcement to find a location. If you cannot return the medication, ask your pharmacist or care team how to get rid of this medication safely. NOTE: This sheet is a summary. It may not cover all possible information. If you have questions about this medicine, talk to your doctor, pharmacist, or health care provider.  2024 Elsevier/Gold Standard (2021-09-25 00:00:00)

## 2023-07-20 NOTE — Progress Notes (Signed)
[facility]    Provider:  Dr Lucia Gaskins Requesting Provider: Lise Auer, MD Primary Care Provider:  Lise Auer, MD  CC:  Migraines  HPI:  Sherry Dunn is a 52 y.o. female here as requested by Lise Auer, MD for migraine. has Postoperative state; Hyperacusis; and Tinnitus of left ear on their problem list.  Also from a review of records mixed anxiety and depression, benign essential tremor, migraines, allergic rhinitis, headache, adjustment disorder, capsulitis and cystitis in her past going back to 2020.  I reviewed referral from [facility] family physicians she was seeing them for mood disorder.said she saw a headache specialist in the past fopr chronic daily headache sh eis on zonisamide has daily headache. Has been on fluoxetine, lamictal, trazodone, abilify, stress at work. Migraines include nausea, numbness and tingling, no aura, symptoms constantly, vertigo she has also tried beta blockers. She is sensitive to light and noise  She has had headaches for as long as she can remember. Started worsening in her 30s and 28s. The degree of pain varies every day she has daily headaches described as on the left side, behind the eyeball and radiates down her neck, pain over her ear, terrible ringing in her left ear, ear pain, nausea, throbbing/pulsating/pounding, nausea, light and sound sensitivity, strong smells can make it worse, a dark quiet room and sleep helps. Dail migraines and headaches that can last 24 hours or longer.  Triptans make her nauseated and takes with zofran and alprazolam. Laying down helps, staying still(hurts to move), she has hearing loss and ringing in the left ear, she has been to an audiologist diagnosed with hyperacusis, she had dizziness and vertigo. Can last 24hours or all day and every day. Emgality and ubrelvy help a litle but on it and sti;ll has daily headaches and migraines. Moderate to severe daily. No aura. No medication overuse. No other focal  neurologic deficits, associated symptoms, inciting events or modifiable factors.  Medications tried > 3 months: zonisamide, currently on fluoxetine, currently on lamictal, trazodone, abilify, metoprolol, currently on emgality(helping a little but still with chronic migraines) and ubrelvy, , amitriptyline and nortriptyline contraindicated already on a seratonergic drug may cause seratonin syndrome, frovatriptan, sumatriptan, celecoxib, meclizine, decadron, advil, toradol, tizanidine, topiramate,trokendi, triptans make her arm feel weak and feels weird sensation from her left side of her chest down her arm triptans contraindicated. Aimovig contraindicated due constipation and diverticulosis. Fioricet.    Reviewed notes, labs and imaging from outside physicians, which showed:  CT head 01/17/2019:  Narrative & Impression  CLINICAL DATA:  Progressing headaches   EXAM: CT HEAD WITHOUT CONTRAST   TECHNIQUE: Contiguous axial images were obtained from the base of the skull through the vertex without intravenous contrast.   COMPARISON:  None.   FINDINGS: Brain: Ventricles are normal in size and configuration. There is no intracranial mass, hemorrhage, extra-axial fluid collection, or midline shift. The brain parenchyma appears unremarkable. There is no evident acute infarct.   Vascular: There is no hyperdense vessel. There is no evident vascular calcification.   Skull: The bony calvarium appears intact.   Sinuses/Orbits: Paranasal sinuses are clear. Orbits appear symmetric bilaterally.   Other: Mastoid air cells are clear.   IMPRESSION: Study within normal limits.     Electronically Signed   By: Bretta Bang III M.D.   On: 02/13/2019 16:52    Review of Systems: Patient complains of symptoms per HPI as well as the following symptoms none. Pertinent negatives and positives per  HPI. All others negative.   Social History   Socioeconomic History   Marital status: Married     Spouse name: Not on file   Number of children: Not on file   Years of education: Not on file   Highest education level: Not on file  Occupational History   Occupation: Nurse  Tobacco Use   Smoking status: Never   Smokeless tobacco: Never  Vaping Use   Vaping status: Never Used  Substance and Sexual Activity   Alcohol use: No   Drug use: No   Sexual activity: Yes    Partners: Male    Comment: 1st intercourse- 52, partners- 6, married- 4.77yrs   Other Topics Concern   Not on file  Social History Narrative   Caffiene:  1 can soda daily (decaf)   Working:  Engineer, 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[employer])   Live husband (grown kids)   Social Determinants of Health   Financial Resource Strain: Not on file  Food Insecurity: Not on file  Transportation Needs: Not on file  Physical Activity: Not on file  Stress: Not on file  Social Connections: Not on file  Intimate Partner Violence: Not on file    Family History  Problem Relation Age of Onset   Diabetes Mother    Irritable bowel syndrome Mother    Hypertension Mother    Thyroid disease Mother    Myasthenia gravis Mother    Pernicious anemia Mother    Mitral valve prolapse Mother    Diabetes Father    Cancer Father        skin   Migraines Maternal Grandmother    Lung cancer Paternal Grandfather    Lung cancer Paternal Uncle    Throat cancer Paternal Uncle     Past Medical History:  Diagnosis Date   Allergies    Anxiety    Diverticulosis    GERD (gastroesophageal reflux disease)    Headache    PONV (postoperative nausea and vomiting)    nausea only    Vaginal delivery 08/17/1999    Patient Active Problem List   Diagnosis Date Noted   Hyperacusis 12/25/2015   Tinnitus of left ear 12/25/2015   Postoperative state 03/24/2015    Past Surgical History:  Procedure Laterality Date   CHOLECYSTECTOMY     CHOLECYSTECTOMY  2015   COLPOSCOPY  2003   CYSTOSCOPY  1998   dr. Logan Bores   DIAGNOSTIC LAPAROSCOPY     LAPAROSCOPIC BILATERAL  SALPINGECTOMY Bilateral 03/24/2015   Procedure: LAPAROSCOPIC BILATERAL SALPINGECTOMY;  Surgeon: Genia Del, MD;  Location: [facility];  Service: Gynecology;  Laterality: Bilateral;   PELVIC LAPAROSCOPY     ROBOTIC ASSISTED TOTAL HYSTERECTOMY N/A 03/24/2015   Procedure: ROBOTIC ASSISTED TOTAL HYSTERECTOMY WITH BILATERAL SALPINGECTOMY, FULGUATION OF PELVIC ENDOMETERIOSIS;  Surgeon: Genia Del, MD;  Location: [facility];  Service: Gynecology;  Laterality: N/A;   WISDOM TOOTH EXTRACTION      Current Outpatient Medications  Medication Sig Dispense Refill   ALPRAZolam (XANAX) 1 MG tablet   3   celecoxib (CELEBREX) 200 MG capsule Take 200 mg by mouth daily as needed.     dicyclomine (BENTYL) 20 MG tablet Take 20 mg by mouth 3 (three) times daily as needed.     famotidine (PEPCID) 20 MG tablet Take 20 mg by mouth at bedtime as needed.     FLUoxetine (PROZAC) 20 MG capsule Take 20 mg by mouth daily.     Fremanezumab-vfrm ([name]) 225 MG/1.5ML SOAJ Inject 225 mg into the  skin every 30 (thirty) days. 1.5 mL 0   Fremanezumab-vfrm ([name]) 225 MG/1.5ML SOAJ Inject 225 mg into the skin every 30 (thirty) days. Please use copay card: BIN# [id number] PCN# [id number] GRP# [id number] ID# [id number] 1.5 mL 11   LamoTRIgine 50 MG TBDP Take 100 mg by mouth 2 (two) times daily.     metoprolol succinate (TOPROL-XL) 100 MG 24 hr tablet Take 100 mg by mouth daily. Take with or immediately following a meal.     omeprazole (PRILOSEC) 40 MG capsule TAKE 1 CAPSULE BY MOUTH ONCE DAILY BEFORE BREAKFAST 30 capsule 0   ondansetron (ZOFRAN-ODT) 4 MG disintegrating tablet Take 4 mg by mouth every 8 (eight) hours as needed for nausea or vomiting.     Rimegepant Sulfate (NURTEC) 75 MG TBDP Take 1 tablet (75 mg total) by mouth daily as needed. For migraines. Take as close to onset of migraine as possible. One daily maximum. 8 tablet 0   rizatriptan (MAXALT) 10 MG tablet Take 10 mg by mouth as needed.     traZODone (DESYREL) 50 MG tablet Take  50 mg by mouth at bedtime.     Galcanezumab-gnlm (EMGALITY) 120 MG/ML SOAJ Inject into the skin. (Patient not taking: Reported on 07/20/2023)     Current Facility-Administered Medications  Medication Dose Route Frequency Provider Last Rate Last Admin   0.9 %  sodium chloride infusion  500 mL Intravenous Once Napoleon Form, MD        Allergies as of 07/20/2023 - Review Complete 07/20/2023  Allergen Reaction Noted   Flagyl [metronidazole] Hives 03/20/2015   Macrobid [nitrofurantoin] Hives 11/12/2019   Sulfa antibiotics Hives 03/20/2015    Vitals: BP (!) 146/89   Pulse 69   Ht 5\' 4"  (1.626 m)   Wt 146 lb 6.4 oz (66.4 kg)   LMP 03/10/2015   BMI 25.13 kg/m  Last Weight:  Wt Readings from Last 1 Encounters:  07/20/23 146 lb 6.4 oz (66.4 kg)   Last Height:   Ht Readings from Last 1 Encounters:  07/20/23 5\' 4"  (1.626 m)     Physical exam: Exam: Gen: NAD, conversant, well nourised,  well groomed                     CV: RRR, no MRG. No Carotid Bruits. No peripheral edema, warm, nontender Eyes: Conjunctivae clear without exudates or hemorrhage  Neuro: Detailed Neurologic Exam  Speech:    Speech is normal; fluent and spontaneous with normal comprehension.  Cognition:    The patient is oriented to person, place, and time;     recent and remote memory intact;     language fluent;     normal attention, concentration,     fund of knowledge Cranial Nerves:    The pupils are equal, round, and reactive to light. The fundi are flat. Visual fields are full to finger confrontation. Extraocular movements are intact. Trigeminal sensation is intact and the muscles of mastication are normal. The face is symmetric. The palate elevates in the midline. Hearing intact. Voice is normal. Shoulder shrug is normal. The tongue has normal motion without fasciculations.   Coordination: nml  Gait: nml  Motor Observation:    No asymmetry, no atrophy, and no involuntary movements  noted. Tone:    Normal muscle tone.    Posture:    Posture is normal. normal erect    Strength:    Strength is V/V in the upper and lower limbs.  Sensation: intact to LT     Reflex Exam:  DTR's:    Deep tendon reflexes in the upper and lower extremities are normal bilaterally.   Toes:    The toes are downgoing bilaterally.   Clonus:    Clonus is absent.    Assessment/Plan:  Patient with Chronic Migraines:, and  Vertigo. Dizziness, Tinnitus of left ear, Hearing loss of left ear, unspecified hearing loss type, Chronic daily headache, Chronic migraine without aura, with intractable migraine, so stated, with status migrainosus, and Worsening headaches. Daily migraines.   MRI brain w/wo contrast: due to  Vertigo. Dizziness, Tinnitus of left ear, Hearing loss of left ear, unspecified hearing loss type, Chronic daily headache Discussed Preventative: could try CGRP medications: Emgality again or [name] injections. Also Qulipta. Vyepti.  Discussed Botox. I love to use Botox and a CGRP medication. Consider botox in the future.  Acute: try Nurtec samples and Ifyou liked them I could prescribe OR if you liked the ubrelvy better I could prescribe that.  Plan: Switch to [name] for prevention. Nurtec samples take one as needed for migraine. If you like it we can prescribe nurtec or ubrelvy if you think that worked better.  Orders Placed This Encounter  Procedures   MR BRAIN/IAC W WO CONTRAST   CBC with Differential/Platelets   Comprehensive metabolic panel   TSH Rfx on Abnormal to Free T4   Meds ordered this encounter  Medications   Rimegepant Sulfate (NURTEC) 75 MG TBDP    Sig: Take 1 tablet (75 mg total) by mouth daily as needed. For migraines. Take as close to onset of migraine as possible. One daily maximum.    Dispense:  8 tablet    Refill:  0   Fremanezumab-vfrm ([name]) 225 MG/1.5ML SOAJ    Sig: Inject 225 mg into the skin every 30 (thirty) days.    Dispense:  1.5 mL     Refill:  0   Fremanezumab-vfrm ([name]) 225 MG/1.5ML SOAJ    Sig: Inject 225 mg into the skin every 30 (thirty) days. Please use copay card: BIN# [id number] PCN# [id number] GRP# [id number] ID# [id number]    Dispense:  1.5 mL    Refill:  11    Please use copay card: BIN# [id number] PCN# [id number] GRP# [id number] ID# [id number]    Cc: Lise Auer, MD,  Lise Auer, MD  Naomie Dean, MD  [facility] [address] [place], [place] [address]  Phone [phone] Fax [phone]

## 2023-07-21 LAB — COMPREHENSIVE METABOLIC PANEL
ALT: 55 [IU]/L — ABNORMAL HIGH (ref 0–32)
AST: 47 [IU]/L — ABNORMAL HIGH (ref 0–40)
Albumin: 4.4 g/dL (ref 3.8–4.9)
Alkaline Phosphatase: 99 [IU]/L (ref 44–121)
BUN/Creatinine Ratio: 13 (ref 9–23)
BUN: 8 mg/dL (ref 6–24)
Bilirubin Total: 0.3 mg/dL (ref 0.0–1.2)
CO2: 22 mmol/L (ref 20–29)
Calcium: 9.8 mg/dL (ref 8.7–10.2)
Chloride: 104 mmol/L (ref 96–106)
Creatinine, Ser: 0.62 mg/dL (ref 0.57–1.00)
Globulin, Total: 2.5 g/dL (ref 1.5–4.5)
Glucose: 165 mg/dL — ABNORMAL HIGH (ref 70–99)
Potassium: 4.5 mmol/L (ref 3.5–5.2)
Sodium: 140 mmol/L (ref 134–144)
Total Protein: 6.9 g/dL (ref 6.0–8.5)
eGFR: 106 mL/min/{1.73_m2} (ref >59)

## 2023-07-21 LAB — CBC WITH DIFFERENTIAL/PLATELET
Basophils Absolute: 0 10*3/uL (ref 0.0–0.2)
Basos: 0 %
EOS (ABSOLUTE): 0.2 10*3/uL (ref 0.0–0.4)
Eos: 2 %
Hematocrit: 40.7 % (ref 34.0–46.6)
Hemoglobin: 13.6 g/dL (ref 11.1–15.9)
Immature Grans (Abs): 0 10*3/uL (ref 0.0–0.1)
Immature Granulocytes: 1 %
Lymphocytes Absolute: 2 10*3/uL (ref 0.7–3.1)
Lymphs: 27 %
MCH: 30 pg (ref 26.6–33.0)
MCHC: 33.4 g/dL (ref 31.5–35.7)
MCV: 90 fL (ref 79–97)
Monocytes Absolute: 0.7 10*3/uL (ref 0.1–0.9)
Monocytes: 9 %
Neutrophils Absolute: 4.5 10*3/uL (ref 1.4–7.0)
Neutrophils: 61 %
Platelets: 339 10*3/uL (ref 150–450)
RBC: 4.53 x10E6/uL (ref 3.77–5.28)
RDW: 12.3 % (ref 11.7–15.4)
WBC: 7.5 10*3/uL (ref 3.4–10.8)

## 2023-07-21 LAB — TSH RFX ON ABNORMAL TO FREE T4: TSH: 0.975 u[IU]/mL (ref 0.450–4.500)

## 2023-08-02 ENCOUNTER — Ambulatory Visit (INDEPENDENT_AMBULATORY_CARE_PROVIDER_SITE_OTHER): Payer: BC Managed Care – PPO

## 2023-08-02 DIAGNOSIS — R519 Headache, unspecified: Secondary | ICD-10-CM | POA: Diagnosis not present

## 2023-08-02 DIAGNOSIS — H9312 Tinnitus, left ear: Secondary | ICD-10-CM | POA: Diagnosis not present

## 2023-08-02 DIAGNOSIS — H9192 Unspecified hearing loss, left ear: Secondary | ICD-10-CM

## 2023-08-02 DIAGNOSIS — R42 Dizziness and giddiness: Secondary | ICD-10-CM

## 2023-08-02 MED ORDER — GADOBENATE DIMEGLUMINE 529 MG/ML IV SOLN
14.0000 mL | Freq: Once | INTRAVENOUS | Status: AC | PRN
  Administered 2023-08-02: 14 mL via INTRAVENOUS

## 2023-08-04 ENCOUNTER — Encounter: Payer: Self-pay | Admitting: Neurology

## 2023-08-04 DIAGNOSIS — G43711 Chronic migraine without aura, intractable, with status migrainosus: Secondary | ICD-10-CM

## 2023-08-04 MED ORDER — NURTEC 75 MG PO TBDP: 75.0000 mg | ORAL_TABLET | Freq: Every day | ORAL | 3 refills | Status: DC | PRN

## 2023-09-19 ENCOUNTER — Encounter: Payer: Self-pay | Admitting: Neurology

## 2023-09-22 ENCOUNTER — Other Ambulatory Visit (HOSPITAL_COMMUNITY): Payer: Self-pay

## 2023-09-22 ENCOUNTER — Telehealth: Payer: Self-pay

## 2023-09-22 NOTE — Telephone Encounter (Signed)
 Pharmacy Patient Advocate Encounter   Received notification from Patient Advice Request messages that prior authorization for [name]  (fremanezumab -vfrm) injection 225MG /1.5ML auto-injectors is required/requested.   Insurance verification completed.   The patient is insured through [facility] .   Per test claim: PA required; PA submitted to above mentioned insurance via CoverMyMeds Key/confirmation #/EOC [id number] Status is pending

## 2023-09-22 NOTE — Telephone Encounter (Signed)
 Pharmacy Patient Advocate Encounter   Received notification from Patient Advice Request messages that prior authorization for Nurtec 75MG  dispersible tablets is required/requested.   Insurance verification completed.   The patient is insured through [facility] .   Per test claim: PA required; PA submitted to above mentioned insurance via CoverMyMeds Key/confirmation #/EOC [id number] Status is pending

## 2023-09-29 DIAGNOSIS — R1013 Epigastric pain: Secondary | ICD-10-CM | POA: Diagnosis not present

## 2023-10-03 ENCOUNTER — Other Ambulatory Visit (HOSPITAL_COMMUNITY): Payer: Self-pay

## 2023-10-03 NOTE — Telephone Encounter (Signed)
 Pharmacy Patient Advocate Encounter  Received notification from [facility] that Prior Authorization for Nurtec has been APPROVED from 09/22/2023 to 12/15/2023. Unable to obtain price due to refill too soon rejection, last fill date 09/27/2023 next available fill date03/11/2023

## 2023-10-04 ENCOUNTER — Other Ambulatory Visit (HOSPITAL_COMMUNITY): Payer: Self-pay

## 2023-10-04 NOTE — Telephone Encounter (Signed)
 Pharmacy Patient Advocate Encounter  Received notification from [facility] that Prior Authorization for [name] (fremanezumab-vfrm) injection 225MG /1.5ML auto-injectors has been APPROVED from 09/22/2023 to 12/15/2023   PA #/Case ID/Reference #: PA Case ID #: [id number]

## 2023-10-04 NOTE — Telephone Encounter (Signed)
 Called the patient and LVM (Ok per [facility]) letting her know the [name] and Nurtec have both been approved through 12/15/23. I advised the pt she can call [facility] to get the refills. I also sent her a Clinical cytogeneticist message.

## 2023-12-12 ENCOUNTER — Other Ambulatory Visit (HOSPITAL_COMMUNITY): Payer: Self-pay

## 2023-12-12 ENCOUNTER — Telehealth: Payer: Self-pay

## 2023-12-12 NOTE — Telephone Encounter (Signed)
   Received a notice from [facility] PA needed for Nurtec-however per CMM No PA needed at this time.

## 2023-12-14 ENCOUNTER — Telehealth: Payer: BC Managed Care – PPO | Admitting: Neurology

## 2023-12-14 ENCOUNTER — Telehealth: Payer: Self-pay | Admitting: Neurology

## 2023-12-14 DIAGNOSIS — R42 Dizziness and giddiness: Secondary | ICD-10-CM

## 2023-12-14 DIAGNOSIS — H9192 Unspecified hearing loss, left ear: Secondary | ICD-10-CM

## 2023-12-14 DIAGNOSIS — G43711 Chronic migraine without aura, intractable, with status migrainosus: Secondary | ICD-10-CM | POA: Insufficient documentation

## 2023-12-14 DIAGNOSIS — G43009 Migraine without aura, not intractable, without status migrainosus: Secondary | ICD-10-CM | POA: Insufficient documentation

## 2023-12-14 DIAGNOSIS — H9312 Tinnitus, left ear: Secondary | ICD-10-CM

## 2023-12-14 MED ORDER — NURTEC 75 MG PO TBDP: 75.0000 mg | ORAL_TABLET | Freq: Every day | ORAL | 11 refills | Status: AC | PRN

## 2023-12-14 MED ORDER — AJOVY 225 MG/1.5ML ~~LOC~~ SOAJ: 225.0000 mg | SUBCUTANEOUS | 11 refills | Status: AC

## 2023-12-14 NOTE — Telephone Encounter (Signed)
 Please schedule f/u march 2026 with megan millikan video med check/refill thank you

## 2023-12-14 NOTE — Telephone Encounter (Signed)
 LVM and sent MyChart msg asking pt to call back and schedule. If pt calls in, please schedule VV with Jacqlyn Matas in March of 2026.

## 2023-12-14 NOTE — Progress Notes (Signed)
 [facility]    Provider:  Dr Tresia Fruit Requesting Provider: Beecher Bower, MD Primary Care Provider:  Beecher Bower, MD  CC:  Migraines  Virtual Visit via Video Note  I connected with Aleatha America on 12/14/23 at  2:00 PM EDT by a video enabled telemedicine application and verified that I am speaking with the correct person using two identifiers.  Location: Patient: home Provider: office   I discussed the limitations of evaluation and management by telemedicine and the availability of in person appointments. The patient expressed understanding and agreed to proceed.    Follow Up Instructions:    I discussed the assessment and treatment plan with the patient. The patient was provided an opportunity to ask questions and all were answered. The patient agreed with the plan and demonstrated an understanding of the instructions.   The patient was advised to call back or seek an in-person evaluation if the symptoms worsen or if the condition fails to improve as anticipated.  I provided over 10 minutes of non-face-to-face time during this encounter.   Glory Larsen, MD   12/14/2023: Baseline is daily headaches and migrianes. Now she has about 5 migraine days a month and < 8 total headache days a month. She is feeling tremendously better on the [name]  as prevention, the nurtec works very well. Advised in January to sign up for another copay card. She still has ringing in the ear. Worse with the headaches. Recommended hearing check again and Dxed with hyperacussis, she works in a large urgent care. Discussed: Impact: Hyperacusis can significantly impact a person's quality of life, making it difficult to participate in social activities, work, or even enjoy hobbies.  Treatment: There is no cure for hyperacusis, but there are strategies that can help manage the condition and reduce its impact on daily life. These may include: Sound therapy: Exposure to certain sounds or  noises can help desensitize the auditory system.  Cognitive behavioral therapy (CBT): CBT can help individuals develop coping mechanisms for managing their sensitivity to sound and reduce anxiety related to it.  Tinnitus retraining therapy (TRT): TRT can help retrain the brain to process sounds in a more balanced way.  Hearing aids: In some cases, hearing aids can be used to help amplify sounds that are difficult to hear, potentially reducing the perceived loudness of other sounds.  Lifestyle changes: Avoiding loud noises, using ear protection, and managing stress can also help reduce the impact of hyperacusis Discuss with the audiologist if there are an wearable devises to reduce the noise but not affect hearing. Work in a quieter environment which is not always possible  Patient complains of symptoms per HPI as well as the following symptoms: hearing sensitivity . Pertinent negatives and positives per HPI. All others negative   HPI 07/20/2023:  Georgetta Simar is a 53 y.o. female here as requested by Beecher Bower, MD for migraine. has Postoperative state; Hyperacusis; Tinnitus of left ear; Chronic migraine without aura, with intractable migraine, so stated, with status migrainosus; and Migraine without aura and without status migrainosus, not intractable on their problem list.  Also from a review of records mixed anxiety and depression, benign essential tremor, migraines, allergic rhinitis, headache, adjustment disorder, capsulitis and cystitis in her past going back to 2020.  I reviewed referral from [facility] family physicians she was seeing them for mood disorder.said she saw a headache specialist in the past fopr chronic daily headache sh eis on zonisamide has daily headache. Has been  on fluoxetine , lamictal, trazodone , abilify, stress at work. Migraines include nausea, numbness and tingling, no aura, symptoms constantly, vertigo she has also tried beta blockers. She is sensitive to light and  noise  She has had headaches for as long as she can remember. Started worsening in her 30s and 5s. The degree of pain varies every day she has daily headaches described as on the left side, behind the eyeball and radiates down her neck, pain over her ear, terrible ringing in her left ear, ear pain, nausea, throbbing/pulsating/pounding, nausea, light and sound sensitivity, strong smells can make it worse, a dark quiet room and sleep helps. Dail migraines and headaches that can last 24 hours or longer.  Triptans make her nauseated and takes with zofran  and alprazolam . Laying down helps, staying still(hurts to move), she has hearing loss and ringing in the left ear, she has been to an audiologist diagnosed with hyperacusis, she had dizziness and vertigo. Can last 24hours or all day and every day. Emgality and ubrelvy help a litle but on it and sti;ll has daily headaches and migraines. Moderate to severe daily. No aura. No medication overuse. No other focal neurologic deficits, associated symptoms, inciting events or modifiable factors.  Medications tried > 3 months: zonisamide, currently on fluoxetine , currently on lamictal, trazodone , abilify, metoprolol, currently on emgality(helping a little but still with chronic migraines) and ubrelvy, , amitriptyline and nortriptyline contraindicated already on a seratonergic drug may cause seratonin syndrome, frovatriptan, sumatriptan, celecoxib, meclizine, decadron , advil , toradol , tizanidine, topiramate ,trokendi , triptans make her arm feel weak and feels weird sensation from her left side of her chest down her arm triptans contraindicated. Aimovig contraindicated due constipation and diverticulosis. Fioricet.    Reviewed notes, labs and imaging from outside physicians, which showed:  CT head 01/17/2019:  Narrative & Impression  CLINICAL DATA:  Progressing headaches   EXAM: CT HEAD WITHOUT CONTRAST   TECHNIQUE: Contiguous axial images were obtained from the base  of the skull through the vertex without intravenous contrast.   COMPARISON:  None.   FINDINGS: Brain: Ventricles are normal in size and configuration. There is no intracranial mass, hemorrhage, extra-axial fluid collection, or midline shift. The brain parenchyma appears unremarkable. There is no evident acute infarct.   Vascular: There is no hyperdense vessel. There is no evident vascular calcification.   Skull: The bony calvarium appears intact.   Sinuses/Orbits: Paranasal sinuses are clear. Orbits appear symmetric bilaterally.   Other: Mastoid air cells are clear.   IMPRESSION: Study within normal limits.     Electronically Signed   By: Mordecai Applebaum III M.D.   On: 02/13/2019 16:52    Review of Systems: Patient complains of symptoms per HPI as well as the following symptoms none. Pertinent negatives and positives per HPI. All others negative.   Social History   Socioeconomic History   Marital status: Married    Spouse name: Not on file   Number of children: Not on file   Years of education: Not on file   Highest education level: Not on file  Occupational History   Occupation: Nurse  Tobacco Use   Smoking status: Never   Smokeless tobacco: Never  Vaping Use   Vaping status: Never Used  Substance and Sexual Activity   Alcohol use: No   Drug use: No   Sexual activity: Yes    Partners: Male    Comment: 1st intercourse- 36, partners- 6, married- 4.69yrs   Other Topics Concern   Not on file  Social History  Narrative   Caffiene:  1 can soda daily (decaf)   Working:  Engineer, 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[employer])   Live husband (grown kids)   Social Drivers of Corporate investment banker Strain: Not on file  Food Insecurity: Not on file  Transportation Needs: Not on file  Physical Activity: Not on file  Stress: Not on file  Social Connections: Not on file  Intimate Partner Violence: Not on file    Family History  Problem Relation Age of Onset   Diabetes Mother     Irritable bowel syndrome Mother    Hypertension Mother    Thyroid disease Mother    Myasthenia gravis Mother    Pernicious anemia Mother    Mitral valve prolapse Mother    Diabetes Father    Cancer Father        skin   Migraines Maternal Grandmother    Lung cancer Paternal Grandfather    Lung cancer Paternal Uncle    Throat cancer Paternal Uncle     Past Medical History:  Diagnosis Date   Allergies    Anxiety    Diverticulosis    GERD (gastroesophageal reflux disease)    Headache    PONV (postoperative nausea and vomiting)    nausea only    Vaginal delivery 08/17/1999    Patient Active Problem List   Diagnosis Date Noted   Chronic migraine without aura, with intractable migraine, so stated, with status migrainosus 12/14/2023   Migraine without aura and without status migrainosus, not intractable 12/14/2023   Hyperacusis 12/25/2015   Tinnitus of left ear 12/25/2015   Postoperative state 03/24/2015    Past Surgical History:  Procedure Laterality Date   CHOLECYSTECTOMY     CHOLECYSTECTOMY  2015   COLPOSCOPY  2003   CYSTOSCOPY  1998   dr. Luster Salters   DIAGNOSTIC LAPAROSCOPY     LAPAROSCOPIC BILATERAL SALPINGECTOMY Bilateral 03/24/2015   Procedure: LAPAROSCOPIC BILATERAL SALPINGECTOMY;  Surgeon: Percy Bracken, MD;  Location: [facility];  Service: Gynecology;  Laterality: Bilateral;   PELVIC LAPAROSCOPY     ROBOTIC ASSISTED TOTAL HYSTERECTOMY N/A 03/24/2015   Procedure: ROBOTIC ASSISTED TOTAL HYSTERECTOMY WITH BILATERAL SALPINGECTOMY, FULGUATION OF PELVIC ENDOMETERIOSIS;  Surgeon: [name] Lavoie, MD;  Location: [facility];  Service: Gynecology;  Laterality: N/A;   WISDOM TOOTH EXTRACTION      Current Outpatient Medications  Medication Sig Dispense Refill   ALPRAZolam  (XANAX ) 1 MG tablet   3   celecoxib (CELEBREX) 200 MG capsule Take 200 mg by mouth daily as needed.     dicyclomine (BENTYL) 20 MG tablet Take 20 mg by mouth 3 (three) times daily as needed.     famotidine  (PEPCID) 20 MG tablet Take 20 mg by mouth at bedtime as needed.     FLUoxetine  (PROZAC ) 20 MG capsule Take 20 mg by mouth daily.     Fremanezumab -vfrm ([name] ) 225 MG/1.5ML SOAJ Inject 225 mg into the skin every 30 (thirty) days. 1.5 mL 0   Fremanezumab -vfrm ([name] ) 225 MG/1.5ML SOAJ Inject 225 mg into the skin every 30 (thirty) days. Please use copay card: BIN# [id number] PCN# [id number] GRP# [id number] ID# [id number] 1.5 mL 11   LamoTRIgine 50 MG TBDP Take 100 mg by mouth 2 (two) times daily.     metoprolol succinate (TOPROL-XL) 100 MG 24 hr tablet Take 100 mg by mouth daily. Take with or immediately following a meal.     omeprazole  (PRILOSEC) 40 MG capsule TAKE 1 CAPSULE BY MOUTH ONCE DAILY BEFORE BREAKFAST 30 capsule  0   ondansetron  (ZOFRAN -ODT) 4 MG disintegrating tablet Take 4 mg by mouth every 8 (eight) hours as needed for nausea or vomiting.     Rimegepant Sulfate (NURTEC) 75 MG TBDP Take 1 tablet (75 mg total) by mouth daily as needed. For migraines. Take as close to onset of migraine as possible. One daily maximum. 16 tablet 11   traZODone  (DESYREL ) 50 MG tablet Take 50 mg by mouth at bedtime.     Current Facility-Administered Medications  Medication Dose Route Frequency Provider Last Rate Last Admin   0.9 %  sodium chloride  infusion  500 mL Intravenous Once Nandigam, Kavitha V, MD        Allergies as of 12/14/2023 - Review Complete 07/20/2023  Allergen Reaction Noted   Flagyl [metronidazole] Hives 03/20/2015   Macrobid [nitrofurantoin] Hives 11/12/2019   Sulfa antibiotics Hives 03/20/2015    Vitals: LMP 03/10/2015  Last Weight:  Wt Readings from Last 1 Encounters:  07/20/23 146 lb 6.4 oz (66.4 kg)   Last Height:   Ht Readings from Last 1 Encounters:  07/20/23 5\' 4"  (1.626 m)     Physical exam: Exam: Gen: NAD, conversant      CV: No palpitations or chest pain or SOB. VS: Breathing at a normal rate. Weight appears within normal limits. Not febrile. Eyes: Conjunctivae clear without  exudates or hemorrhage  Neuro: Detailed Neurologic Exam  Speech:    Speech is normal; fluent and spontaneous with normal comprehension.  Cognition:    The patient is oriented to person, place, and time;     recent and remote memory intact;     language fluent;     normal attention, concentration, fund of knowledge Cranial Nerves:    The pupils are equal, round, and reactive to light. Visual fields are full Extraocular movements are intact.  The face is symmetric with normal sensation. The palate elevates in the midline. Hearing intact. Voice is normal. Shoulder shrug is normal. The tongue has normal motion without fasciculations.   Coordination: normal  Gait:    No abnormalities noted or reported  Motor Observation:   no involuntary movements noted. Tone:    Appears normal  Posture:    Posture is normal. normal erect    Strength:    Strength is anti-gravity and symmetric in the upper and lower limbs.      Sensation: intact to LT, no reports of numbness or tingling or paresthesias         Assessment/Plan:  Patient with Chronic Migraines:, and  Vertigo. Dizziness, Tinnitus of left ear, Hearing loss of left ear, unspecified hearing loss type, Chronic daily headache, Chronic migraine without aura, with intractable migraine, so stated, with status migrainosus, and Worsening headaches. Daily migraines.   MRI brain w/wo contrast IAC protocol: unremarkable thin cuts of the internal auditory canal Baseline is daily headaches and migrianes. Now she has about 5 migraine days a month and < 8 total headache days a month. She is feeling tremendously better on the [name]  as prevention, the nurtec works very well. Advised in January to sign up for another copay card. She still has ringing in the ear. Worse with the headaches. Recommended hearing check again and Dxed with hyperacussis, she works in a large urgent care. Discussed: Impact: Hyperacusis can significantly impact a person's  quality of life, making it difficult to participate in social activities, work, or even enjoy hobbies.  Treatment: There is no cure for hyperacusis, but there are strategies that can help manage the  condition and reduce its impact on daily life. These may include: Sound therapy: Exposure to certain sounds or noises can help desensitize the auditory system.  Cognitive behavioral therapy (CBT): CBT can help individuals develop coping mechanisms for managing their sensitivity to sound and reduce anxiety related to it.  Tinnitus retraining therapy (TRT): TRT can help retrain the brain to process sounds in a more balanced way.  Hearing aids: In some cases, hearing aids can be used to help amplify sounds that are difficult to hear, potentially reducing the perceived loudness of other sounds.  Lifestyle changes: Avoiding loud noises, using ear protection, and managing stress can also help reduce the impact of hyperacusis Discuss with the audiologist if there are an wearable devises to reduce the noise but not affect hearing. Work in a quieter environment which is not always possible  Plan: Continue [name]  for prevention. Continue Nurtec prn. Follow up in a bout a year for med check and refill.    Meds ordered this encounter  Medications   Fremanezumab -vfrm ([name] ) 225 MG/1.5ML SOAJ    Sig: Inject 225 mg into the skin every 30 (thirty) days. Please use copay card: BIN# [id number] PCN# [id number] GRP# [id number] ID# [id number]    Dispense:  1.5 mL    Refill:  11    Please use copay card: BIN# [id number] PCN# [id number] GRP# [id number] ID# [id number]   Rimegepant Sulfate (NURTEC) 75 MG TBDP    Sig: Take 1 tablet (75 mg total) by mouth daily as needed. For migraines. Take as close to onset of migraine as possible. One daily maximum.    Dispense:  16 tablet    Refill:  11    Cc: Beecher Bower, MD,  Beecher Bower, MD  Aldona Amel, MD  [facility] [address] [place], [place]  [address]  Phone [phone] Fax [phone]

## 2023-12-22 ENCOUNTER — Telehealth: Payer: Self-pay | Admitting: Neurology

## 2023-12-22 NOTE — Telephone Encounter (Signed)
 1 yr f/u made for 2026

## 2024-02-14 DIAGNOSIS — R1310 Dysphagia, unspecified: Secondary | ICD-10-CM | POA: Diagnosis not present

## 2024-02-14 DIAGNOSIS — K219 Gastro-esophageal reflux disease without esophagitis: Secondary | ICD-10-CM | POA: Diagnosis not present

## 2024-02-14 DIAGNOSIS — R194 Change in bowel habit: Secondary | ICD-10-CM | POA: Diagnosis not present

## 2024-02-21 ENCOUNTER — Telehealth: Payer: Self-pay

## 2024-02-21 ENCOUNTER — Other Ambulatory Visit (HOSPITAL_COMMUNITY): Payer: Self-pay

## 2024-02-21 NOTE — Telephone Encounter (Signed)
 Pharmacy Patient Advocate Encounter   Received notification from Fax that prior authorization for Nurtec 75MG  dispersible tablets is required/requested.   Insurance verification completed.   The patient is insured through [employer] .   Per test claim: PA required; PA submitted to above mentioned insurance via CoverMyMeds Key/confirmation #/EOC [id number] Status is pending

## 2024-02-24 ENCOUNTER — Other Ambulatory Visit (HOSPITAL_COMMUNITY): Payer: Self-pay

## 2024-02-24 ENCOUNTER — Telehealth: Payer: Self-pay

## 2024-02-24 NOTE — Telephone Encounter (Signed)
 Pharmacy Patient Advocate Encounter  Received notification from [facility] that Prior Authorization for Nurtec 75MG  dispersible tablets has been APPROVED from 02/21/2024 to 02/20/2025. Unable to obtain price due to refill too soon rejection, last fill date 02/23/2024 next available fill date7/31/2025   PA #/Case ID/Reference #: PA Case ID #: [id number]

## 2024-02-24 NOTE — Telephone Encounter (Signed)
 Pharmacy Patient Advocate Encounter   Received notification from Fax that prior authorization for [name]  (fremanezumab -vfrm) injection 225MG /1.5ML auto-injectors is required/requested.   Insurance verification completed.   The patient is insured through [facility] .   Per test claim: PA required; PA submitted to above mentioned insurance via CoverMyMeds Key/confirmation #/EOC [id number] Status is pending

## 2024-02-28 ENCOUNTER — Other Ambulatory Visit (HOSPITAL_COMMUNITY): Payer: Self-pay

## 2024-02-28 NOTE — Telephone Encounter (Signed)
 Pharmacy Patient Advocate Encounter  Received notification from [facility] that Prior Authorization for [name]  (fremanezumab -vfrm) injection 225MG /1.5ML auto-injectors has been APPROVED from 02/24/2024 to 02/23/2025. Unable to obtain price due to refill too soon rejection, last fill date 02/27/2024 next available fill date8/01/2024   PA #/Case ID/Reference #: PA Case ID #: [id number]

## 2024-05-23 DIAGNOSIS — Z131 Encounter for screening for diabetes mellitus: Secondary | ICD-10-CM | POA: Diagnosis not present

## 2024-05-23 DIAGNOSIS — I10 Essential (primary) hypertension: Secondary | ICD-10-CM | POA: Diagnosis not present

## 2024-05-23 DIAGNOSIS — Z13220 Encounter for screening for lipoid disorders: Secondary | ICD-10-CM | POA: Diagnosis not present

## 2024-05-23 DIAGNOSIS — F4323 Adjustment disorder with mixed anxiety and depressed mood: Secondary | ICD-10-CM | POA: Diagnosis not present

## 2024-05-23 DIAGNOSIS — G43909 Migraine, unspecified, not intractable, without status migrainosus: Secondary | ICD-10-CM | POA: Diagnosis not present

## 2024-05-23 DIAGNOSIS — Z0000 Encounter for general adult medical examination without abnormal findings: Secondary | ICD-10-CM | POA: Diagnosis not present

## 2024-07-15 DIAGNOSIS — Z79899 Other long term (current) drug therapy: Secondary | ICD-10-CM | POA: Diagnosis not present

## 2024-07-15 DIAGNOSIS — I10 Essential (primary) hypertension: Secondary | ICD-10-CM | POA: Diagnosis not present

## 2024-07-17 DIAGNOSIS — G43709 Chronic migraine without aura, not intractable, without status migrainosus: Secondary | ICD-10-CM | POA: Diagnosis not present

## 2024-07-17 DIAGNOSIS — I10 Essential (primary) hypertension: Secondary | ICD-10-CM | POA: Diagnosis not present

## 2024-07-17 DIAGNOSIS — G43909 Migraine, unspecified, not intractable, without status migrainosus: Secondary | ICD-10-CM | POA: Diagnosis not present

## 2024-07-17 DIAGNOSIS — Z6824 Body mass index (BMI) 24.0-24.9, adult: Secondary | ICD-10-CM | POA: Diagnosis not present

## 2024-09-06 ENCOUNTER — Other Ambulatory Visit: Payer: Self-pay

## 2024-12-17 ENCOUNTER — Ambulatory Visit: Admitting: Neurology
